# Patient Record
Sex: Female | Born: 1970
Health system: Southern US, Community
[De-identification: ages and names within clinical notes are randomized; demographics above are authoritative.]

## PROBLEM LIST (undated history)

## (undated) DIAGNOSIS — D219 Benign neoplasm of connective and other soft tissue, unspecified: Secondary | ICD-10-CM

## (undated) DIAGNOSIS — E119 Type 2 diabetes mellitus without complications: Secondary | ICD-10-CM

## (undated) DIAGNOSIS — D649 Anemia, unspecified: Secondary | ICD-10-CM

## (undated) DIAGNOSIS — E669 Obesity, unspecified: Secondary | ICD-10-CM

## (undated) DIAGNOSIS — J42 Unspecified chronic bronchitis: Secondary | ICD-10-CM

## (undated) DIAGNOSIS — E785 Hyperlipidemia, unspecified: Secondary | ICD-10-CM

## (undated) DIAGNOSIS — M549 Dorsalgia, unspecified: Secondary | ICD-10-CM

## (undated) DIAGNOSIS — G8929 Other chronic pain: Secondary | ICD-10-CM

## (undated) DIAGNOSIS — I10 Essential (primary) hypertension: Secondary | ICD-10-CM

## (undated) HISTORY — DX: Essential (primary) hypertension: I10

## (undated) HISTORY — DX: Type 2 diabetes mellitus without complications: E11.9

## (undated) HISTORY — DX: Hyperlipidemia, unspecified: E78.5

## (undated) HISTORY — DX: Dorsalgia, unspecified: M54.9

## (undated) HISTORY — PX: TIBIA FRACTURE SURGERY: SHX806

## (undated) HISTORY — DX: Other chronic pain: G89.29

## (undated) HISTORY — DX: Obesity, unspecified: E66.9

## (undated) HISTORY — PX: CHOLECYSTECTOMY: SHX55

---

## 1998-06-27 ENCOUNTER — Inpatient Hospital Stay (HOSPITAL_COMMUNITY): Admission: AD | Admit: 1998-06-27 | Discharge: 1998-06-30 | Payer: Self-pay | Admitting: Obstetrics and Gynecology

## 1999-12-27 ENCOUNTER — Other Ambulatory Visit: Admission: RE | Admit: 1999-12-27 | Discharge: 1999-12-27 | Payer: Self-pay | Admitting: Obstetrics and Gynecology

## 2003-12-14 ENCOUNTER — Ambulatory Visit (HOSPITAL_COMMUNITY): Admission: RE | Admit: 2003-12-14 | Discharge: 2003-12-14 | Payer: Self-pay | Admitting: Obstetrics and Gynecology

## 2005-04-15 ENCOUNTER — Ambulatory Visit: Payer: Self-pay | Admitting: Internal Medicine

## 2005-10-21 ENCOUNTER — Ambulatory Visit: Payer: Self-pay | Admitting: Internal Medicine

## 2005-10-24 ENCOUNTER — Ambulatory Visit: Payer: Self-pay | Admitting: Internal Medicine

## 2005-10-30 ENCOUNTER — Encounter: Payer: Self-pay | Admitting: Internal Medicine

## 2005-11-02 ENCOUNTER — Encounter: Payer: Self-pay | Admitting: Internal Medicine

## 2006-02-21 ENCOUNTER — Ambulatory Visit: Payer: Self-pay | Admitting: Internal Medicine

## 2006-02-21 ENCOUNTER — Encounter: Payer: Self-pay | Admitting: Family Medicine

## 2006-04-29 ENCOUNTER — Ambulatory Visit (HOSPITAL_COMMUNITY): Admission: EM | Admit: 2006-04-29 | Discharge: 2006-04-30 | Payer: Self-pay | Admitting: Emergency Medicine

## 2006-04-29 ENCOUNTER — Encounter (INDEPENDENT_AMBULATORY_CARE_PROVIDER_SITE_OTHER): Payer: Self-pay | Admitting: *Deleted

## 2006-04-29 ENCOUNTER — Ambulatory Visit: Payer: Self-pay | Admitting: Family Medicine

## 2006-04-29 LAB — CONVERTED CEMR LAB
AST: 33 units/L (ref 0–37)
Albumin: 3.5 g/dL (ref 3.5–5.2)
Alkaline Phosphatase: 59 units/L (ref 39–117)
Basophils Relative: 0 % (ref 0.0–1.0)
Creatinine, Ser: 0.9 mg/dL (ref 0.4–1.2)
GFR calc Af Amer: 91 mL/min
HCT: 37.8 % (ref 36.0–46.0)
Lipase: 19 units/L (ref 11.0–59.0)
MCHC: 32.6 g/dL (ref 30.0–36.0)
MCV: 78.4 fL (ref 78.0–100.0)
Monocytes Absolute: 1.8 10*3/uL — ABNORMAL HIGH (ref 0.2–0.7)
Monocytes Relative: 9.3 % (ref 3.0–11.0)
Neutro Abs: 14.7 10*3/uL — ABNORMAL HIGH (ref 1.4–7.7)
Neutrophils Relative %: 75.4 % (ref 43.0–77.0)
Platelets: 428 10*3/uL — ABNORMAL HIGH (ref 150–400)
Potassium: 3.8 meq/L (ref 3.5–5.1)
Sodium: 139 meq/L (ref 135–145)
Total Bilirubin: 0.9 mg/dL (ref 0.3–1.2)
Total Protein: 7.3 g/dL (ref 6.0–8.3)

## 2006-11-07 HISTORY — PX: ENDOMETRIAL ABLATION: SHX621

## 2006-11-13 ENCOUNTER — Ambulatory Visit (HOSPITAL_COMMUNITY): Admission: RE | Admit: 2006-11-13 | Discharge: 2006-11-13 | Payer: Self-pay | Admitting: Obstetrics and Gynecology

## 2006-11-13 ENCOUNTER — Encounter (INDEPENDENT_AMBULATORY_CARE_PROVIDER_SITE_OTHER): Payer: Self-pay | Admitting: Obstetrics and Gynecology

## 2007-01-09 ENCOUNTER — Ambulatory Visit: Payer: Self-pay | Admitting: Internal Medicine

## 2007-01-09 LAB — CONVERTED CEMR LAB
Bilirubin Urine: NEGATIVE
Glucose, Urine, Semiquant: NEGATIVE
Protein, U semiquant: 100
Urobilinogen, UA: 0.2

## 2007-07-07 ENCOUNTER — Telehealth (INDEPENDENT_AMBULATORY_CARE_PROVIDER_SITE_OTHER): Payer: Self-pay | Admitting: *Deleted

## 2007-08-05 ENCOUNTER — Telehealth (INDEPENDENT_AMBULATORY_CARE_PROVIDER_SITE_OTHER): Payer: Self-pay | Admitting: *Deleted

## 2007-09-16 ENCOUNTER — Ambulatory Visit: Payer: Self-pay | Admitting: Internal Medicine

## 2007-09-16 DIAGNOSIS — I10 Essential (primary) hypertension: Secondary | ICD-10-CM

## 2008-04-21 ENCOUNTER — Ambulatory Visit: Payer: Self-pay | Admitting: Family Medicine

## 2008-04-21 ENCOUNTER — Encounter (INDEPENDENT_AMBULATORY_CARE_PROVIDER_SITE_OTHER): Payer: Self-pay | Admitting: Internal Medicine

## 2008-10-27 ENCOUNTER — Telehealth: Payer: Self-pay | Admitting: Internal Medicine

## 2008-11-07 ENCOUNTER — Ambulatory Visit: Payer: Self-pay | Admitting: Family Medicine

## 2008-11-07 ENCOUNTER — Encounter (INDEPENDENT_AMBULATORY_CARE_PROVIDER_SITE_OTHER): Payer: Self-pay | Admitting: *Deleted

## 2008-11-07 LAB — CONVERTED CEMR LAB: Rapid Strep: POSITIVE

## 2008-11-09 ENCOUNTER — Telehealth: Payer: Self-pay | Admitting: Family Medicine

## 2008-11-09 ENCOUNTER — Ambulatory Visit: Payer: Self-pay | Admitting: Internal Medicine

## 2009-01-10 ENCOUNTER — Ambulatory Visit: Payer: Self-pay | Admitting: Internal Medicine

## 2009-01-11 LAB — CONVERTED CEMR LAB
ALT: 58 units/L — ABNORMAL HIGH (ref 0–35)
AST: 29 units/L (ref 0–37)
Albumin: 3.4 g/dL — ABNORMAL LOW (ref 3.5–5.2)
Alkaline Phosphatase: 62 units/L (ref 39–117)
BUN: 14 mg/dL (ref 6–23)
Bilirubin, Direct: 0.1 mg/dL (ref 0.0–0.3)
Calcium: 8.8 mg/dL (ref 8.4–10.5)
Cholesterol: 238 mg/dL — ABNORMAL HIGH (ref 0–200)
Direct LDL: 187.4 mg/dL
Eosinophils Absolute: 0.2 10*3/uL (ref 0.0–0.7)
GFR calc non Af Amer: 119.27 mL/min (ref >60)
HCT: 38.4 % (ref 36.0–46.0)
HDL: 45 mg/dL (ref >39.00)
Lymphocytes Relative: 40.1 % (ref 12.0–46.0)
Lymphs Abs: 3.8 10*3/uL (ref 0.7–4.0)
MCV: 82.4 fL (ref 78.0–100.0)
Phosphorus: 4.2 mg/dL (ref 2.3–4.6)
Platelets: 329 10*3/uL (ref 150.0–400.0)
Potassium: 3.4 meq/L — ABNORMAL LOW (ref 3.5–5.1)
RDW: 13.5 % (ref 11.5–14.6)
Sodium: 142 meq/L (ref 135–145)
Total Protein: 7 g/dL (ref 6.0–8.3)
VLDL: 24.4 mg/dL (ref 0.0–40.0)
WBC: 9.6 10*3/uL (ref 4.5–10.5)

## 2009-01-26 ENCOUNTER — Ambulatory Visit: Payer: Self-pay | Admitting: Internal Medicine

## 2009-01-26 LAB — CONVERTED CEMR LAB
Albumin: 3.7 g/dL (ref 3.5–5.2)
BUN: 13 mg/dL (ref 6–23)
Calcium: 9.2 mg/dL (ref 8.4–10.5)
Phosphorus: 3.6 mg/dL (ref 2.3–4.6)

## 2009-05-31 ENCOUNTER — Telehealth: Payer: Self-pay | Admitting: Family Medicine

## 2009-05-31 ENCOUNTER — Ambulatory Visit: Payer: Self-pay | Admitting: Family Medicine

## 2009-06-06 ENCOUNTER — Ambulatory Visit: Payer: Self-pay | Admitting: Internal Medicine

## 2009-06-07 LAB — CONVERTED CEMR LAB
Albumin: 3.5 g/dL (ref 3.5–5.2)
BUN: 8 mg/dL (ref 6–23)
CO2: 32 meq/L (ref 19–32)
Creatinine, Ser: 0.7 mg/dL (ref 0.4–1.2)
Glucose, Bld: 61 mg/dL — ABNORMAL LOW (ref 70–99)
Sodium: 142 meq/L (ref 135–145)

## 2009-06-09 ENCOUNTER — Telehealth: Payer: Self-pay | Admitting: Internal Medicine

## 2009-06-09 ENCOUNTER — Encounter: Payer: Self-pay | Admitting: Internal Medicine

## 2009-11-06 ENCOUNTER — Telehealth: Payer: Self-pay | Admitting: Family Medicine

## 2010-01-04 ENCOUNTER — Ambulatory Visit: Payer: Self-pay | Admitting: Family Medicine

## 2010-04-06 ENCOUNTER — Telehealth: Payer: Self-pay | Admitting: Internal Medicine

## 2010-05-06 LAB — CONVERTED CEMR LAB
ALT: 40 units/L — ABNORMAL HIGH (ref 0–35)
Basophils Relative: 1.1 % — ABNORMAL HIGH (ref 0.0–1.0)
Creatinine, Ser: 0.8 mg/dL (ref 0.4–1.2)
Eosinophils Relative: 0.7 % (ref 0.0–5.0)
GFR calc non Af Amer: 85 mL/min
Glucose, Urine, Semiquant: NEGATIVE
Hemoglobin: 12.1 g/dL (ref 12.0–15.0)
Lymphocytes Relative: 39.2 % (ref 12.0–46.0)
MCHC: 33.8 g/dL (ref 30.0–36.0)
MCV: 80.2 fL (ref 78.0–100.0)
Monocytes Absolute: 0.6 10*3/uL (ref 0.1–1.0)
Neutro Abs: 4.7 10*3/uL (ref 1.4–7.7)
Neutrophils Relative %: 52 % (ref 43.0–77.0)
Nitrite: NEGATIVE
Phosphorus: 4.2 mg/dL (ref 2.3–4.6)
Platelets: 337 10*3/uL (ref 150–400)
RBC: 4.47 M/uL (ref 3.87–5.11)
Sed Rate: 19 mm/hr (ref 0–22)
TSH: 1.03 microintl units/mL (ref 0.35–5.50)
Total Bilirubin: 0.7 mg/dL (ref 0.3–1.2)
Urobilinogen, UA: 0.2
WBC Urine, dipstick: NEGATIVE
WBC: 9.1 10*3/uL (ref 4.5–10.5)

## 2010-05-10 NOTE — Assessment & Plan Note (Signed)
Summary: FOLLUP UP, ALC   Vital Signs:  Patient profile:   40 year old female Weight:      294 pounds Temp:     99.0 degrees F oral Pulse rate:   80 / minute Pulse rhythm:   regular Resp:     12 per minute BP sitting:   140 / 78  (left arm) Cuff size:   large  Vitals Entered By: Mervin Hack CMA Duncan Dull) (June 06, 2009 11:53 AM) CC: cough/ congestion   History of Present Illness: Cold is worse started with nagging cough then worsened couldn't sleep--now better with the hycodan  wheezing some SOB cough is dry  some nasal drainage and PND No recent sore throat---just dry and scratchy from the cough No ear pain  No history of asthma  Doing well on current med Checks BP herself--generally 120's/70's No cough or throat symptoms till she got sick no chest pain, dizziness or headaches  Allergies: No Known Drug Allergies  Past History:  Past medical, surgical, family and social histories (including risk factors) reviewed for relevance to current acute and chronic problems.  Past Medical History: Reviewed history from 09/16/2007 and no changes required. Hypertension Obesity  Past Surgical History: Reviewed history from 09/16/2007 and no changes required. C-section x 2 1981  Right leg fracture 1/08 Cholecystectomy 8/08 Endometrial ablation  Family History: Reviewed history from 01/10/2009 and no changes required. Dad with HTN Mom with HTN, DM, CAD glaucoma 1 brothers-1 with seizures 2 sisters--1 has some heart trouble HTN strong in the family CAD strong in family--sister, GM, mom Breast cancer in a distant cousin Bone/lung cancer on dad's side  Social History: Reviewed history from 09/16/2007 and no changes required. 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  NAD Head:  no sinus tenderness Ears:  R ear normal and L ear normal.   Nose:  marked left nasal congestion with white secretions only mild on right Mouth:  no erythema and no exudates.   Neck:  supple, no masses, and no cervical lymphadenopathy.   Lungs:  normal respiratory effort, no intercostal retractions, and no accessory muscle use.  Slight insp and exp wheeze or rhonchi Not tight (no increased exp phase) Heart:  normal rate, regular rhythm, no murmur, and no gallop.   Extremities:  no edema Psych:  normally interactive, good eye contact, not anxious appearing, and not depressed appearing.     Impression & Recommendations:  Problem # 1:  BRONCHITIS- ACUTE (ICD-466.0) Assessment New  persistent infection may suggest atypical bronchitis will try z-pak consider prednisone if breathing gets worse or she is tight  Her updated medication list for this problem includes:    Azithromycin 250 Mg Tabs (Azithromycin) .Marland Kitchen... 2 tabs today, then 1 tab daily for the next 4 days for bronchial infection  Problem # 2:  HYPERTENSION (ICD-401.9) Assessment: Unchanged  doing well on this good measurements at home will recheck renal  Her updated medication list for this problem includes:    Lisinopril-hydrochlorothiazide 10-12.5 Mg Tabs (Lisinopril-hydrochlorothiazide) .Marland Kitchen... Take 1 by mouth once daily  BP today: 140/78 Prior BP: 120/80 (05/31/2009)  Labs Reviewed: K+: 4.2 (01/26/2009) Creat: : 0.8 (01/26/2009)   Chol: 238 (01/10/2009)   HDL: 45.00 (01/10/2009)   TG: 122.0 (  01/10/2009)  Orders: Venipuncture ([address]) TLB-Renal Function Panel (80069-RENAL)  Complete Medication List: 1)  Naproxen 500 Mg Tabs (Naproxen) .Marland Kitchen.. 1 tablet two times a day with food 2)  Lisinopril-hydrochlorothiazide 10-12.5 Mg Tabs (Lisinopril-hydrochlorothiazide) .... Take 1 by mouth once daily 3)  Hydrocodone-acetaminophen 5-325 Mg Tabs (Hydrocodone-acetaminophen)  .Marland Kitchen.. 1 three times a day as needed for severe menstrual pain 4)  Azithromycin 250 Mg Tabs (Azithromycin) .... 2 tabs today, then 1 tab daily for the next 4 days for bronchial infection  Patient Instructions: 1)  Please schedule a follow-up appointment in 6 months for physical Prescriptions: AZITHROMYCIN 250 MG TABS (AZITHROMYCIN) 2 tabs today, then 1 tab daily for the next 4 days for bronchial infection  #6 x 0   Entered and Authorized by:   Cindee Salt MD   Signed by:   Cindee Salt MD on 06/06/2009   Method used:   Electronically to        [facility] [phone]* (retail)       [address] [place], [place]  [address]       Ph: [phone]       Fax: [phone]   RxID:   [id number] HYDROCODONE-ACETAMINOPHEN 5-325 MG TABS (HYDROCODONE-ACETAMINOPHEN) 1 three times a day as needed for severe menstrual pain  #30 x 0   Entered and Authorized by:   Cindee Salt MD   Signed by:   Cindee Salt MD on 06/06/2009   Method used:   Print then Give to Patient   RxID:   [id number]   Current Allergies (reviewed today): No known allergies

## 2010-05-10 NOTE — Assessment & Plan Note (Signed)
Summary: BAD COUGH  CYD   Vital Signs:  Patient profile:   40 year old female Height:      69 inches Weight:      301.75 pounds BMI:     44.72 Temp:     99.2 degrees F oral Pulse rate:   92 / minute Pulse rhythm:   regular BP sitting:   122 / 86  (left arm) Cuff size:   large  Vitals Entered By: Delilah Shan CMA Duncan Dull) (January 04, 2010 4:16 PM) CC: Bad cough   History of Present Illness: Cough- started getting sick about 1 week ago.  Friday night through "Sunday with fatigue, cough, congestion, aches, rhinorrhea.  No fever.  Sx some better since then but with occ wheeze, but no h/o asthma.  Cough persists.  Less throat pain now.  People at work have been ill.  Never used inhaler before.  Cough and wheeze are worse at night.    Cruise in 2 weeks to [place].  Asking for advice re: otc meds for motion sickness.  Rec OTC dramamine.    Allergies: No Known Drug Allergies  Social History: Occupation: Insurance analyst, phone audits Separated--2 children at home Caregiving for mom now Never Smoked Alcohol use-no  Review of Systems       See HPI.  Otherwise negative.    Physical Exam  General:  GEN: nad, alert and oriented HEENT: mucous membranes moist NECK: supple w/o LA CV: rrr.  no murmur PULM: ctab, no inc wob ABD: soft, +bs EXT: no edema SKIN: no acute rash    Impression & Recommendations:  Problem # 1:  COUGH (ICD-786.2) Likely postviral cough.  D/w patient.  No need for antibiotics.  use SABA in meantime as needed and this should gradually improve.  follow up as needed. She understood.   Complete Medication List: 1)  Naproxen 500 Mg Tabs (Naproxen) .... 1 tablet two times a day with food 2)  Lisinopril-hydrochlorothiazide 10-12.5 Mg Tabs (Lisinopril-hydrochlorothiazide) .... Take 1 by mouth once daily 3)  Hydrocodone-acetaminophen 5-325 Mg Tabs (Hydrocodone-acetaminophen) .... 1 three times a day as needed for severe menstrual pain 4)  Proventil Hfa 108 (90  Base) Mcg/act Aers (Albuterol sulfate) .... 2 puffs q4-6 hours as needed for cough/wheeze  Patient Instructions: 1)  Use the proventil- 2 puffs every 4-6 hours as needed.  Let me know if this isn't helping.  Have a good trip.  Take care.  Prescriptions: PROVENTIL HFA 108 (90 BASE) MCG/ACT AERS (ALBUTEROL SULFATE) 2 puffs q4-6 hours as needed for cough/wheeze  #1 x 2   Entered and Authorized by:   Graham Duncan MD   Signed by:   Graham Duncan MD on 01/04/2010   Method used:   Electronically to        [facility]  [address] #[id number]* (retail)       [address]       [place], [place]  [address]       Ph: [phone]       Fax: [phone]   RxID:   [id number]   Current Allergies (reviewed today): No known allergies

## 2010-05-10 NOTE — Letter (Signed)
Summary: Out of Work  [facility]  [address] [place], [place] [address]   Phone: [phone]  Fax: [phone]    June 09, 2009   Employee:  Lindsey Walls    To Whom It May Concern:   For Medical reasons, please excuse the above named employee from work for the following dates:  Start:   06/08/2009  End:   06/12/2009 patient returning back to work  If you need additional information, please feel free to contact our office.         Sincerely,      Tillman Abide, MD

## 2010-05-10 NOTE — Assessment & Plan Note (Signed)
Summary: COUGH AND CONGESTION   Vital Signs:  Patient profile:   40 year old female Height:      69 inches Weight:      295.50 pounds BMI:     43.80 Temp:     98.5 degrees F oral Pulse rate:   68 / minute Pulse rhythm:   regular BP sitting:   120 / 80  (left arm) Cuff size:   large  Vitals Entered By: Linde Gillis CMA Duncan Dull) (May 31, 2009 9:46 AM) CC: cough, congestion   CC:  cough and congestion.  History of Present Illness: 40 year old female:  Cough and sinus drainag efor about a week, wheezing  Having to sit straight  This 40 Years Old Black Female comes in today with complaints of cough, runny nose, mostly coughing and drainage  REVIEW OF SYSTEMS GEN: Acute illness details above. CV: No chest pain or SOB GI: No noted N or V Otherwise, pertinent positives and negatives are noted in the HPI.   GEN: WDWN, NAD; alert,appropriate and cooperative throughout exam HEENT: Normocephalic and atraumatic. Throat clear, w/o exudate, no LAD, R TM clear, L TM - good landmarks, No fluid present. rhinnorhea.  Left frontal and maxillary sinuses: NT Right frontal and maxillary sinuses: NT NECK: No ant or post LAD CV: RRR, No M/G/R PULM: no resp distress, no accessory muscles.  No retractions. no w/c/r ABD: S,NT,ND,+BS, No HSM EXTR: no c/c/e PSYCH: full affect, pleasant, conversant   Allergies (verified): No Known Drug Allergies  Past History:  Past medical, surgical, family and social histories (including risk factors) reviewed, and no changes noted (except as noted below).  Past Medical History: Reviewed history from 09/16/2007 and no changes required. Hypertension Obesity  Past Surgical History: Reviewed history from 09/16/2007 and no changes required. C-section x 2 1981  Right leg fracture 1/08 Cholecystectomy 8/08 Endometrial ablation  Family History: Reviewed history from 01/10/2009 and no changes required. Dad with HTN Mom with HTN, DM, CAD glaucoma 1  brothers-1 with seizures 2 sisters--1 has some heart trouble HTN strong in the family CAD strong in family--sister, GM, mom Breast cancer in a distant cousin Bone/lung cancer on dad's side  Social History: Reviewed history from 09/16/2007 and no changes required. 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 1 tablet two times a day with food  Instructed on symptomatic treatment. Call if symptoms persist or worsen.   Complete Medication List: 1)  Naproxen 500 Mg Tabs (Naproxen) .Marland Kitchen.. 1 tablet two times a day with food 2)  Propoxyphene N-apap 100-650 Mg Tabs (Propoxyphene n-apap) .Marland Kitchen.. 1every 4 hours as needed 3)  Lisinopril-hydrochlorothiazide 10-12.5 Mg Tabs (Lisinopril-hydrochlorothiazide) .... Take 1 by mouth once daily  Patient Instructions: 1)  Mucinex-DM or Robitussin-DM  Current Allergies (reviewed today): No known allergies

## 2010-05-10 NOTE — Progress Notes (Signed)
Summary: Hydrocodone/APAP  Phone Note Refill Request Message from:  [employer] on November 06, 2009 9:52 AM  Refills Requested: Medication #1:  HYDROCODONE-ACETAMINOPHEN 5-325 MG TABS 1 three times a day as needed for severe menstrual pain. [facility]  [place]. #[id number]*   Last Fill Date:  08/16/2009   Pharmacy Phone:  [phone]   Method Requested: Telephone to Pharmacy Initial call taken by: Delilah Shan CMA Duncan Dull),  November 06, 2009 9:52 AM  Follow-up for Phone Call        Rx called to pharmacy Follow-up by: Linde Gillis CMA Duncan Dull),  November 06, 2009 10:15 AM    Prescriptions: HYDROCODONE-ACETAMINOPHEN 5-325 MG TABS (HYDROCODONE-ACETAMINOPHEN) 1 three times a day as needed for severe menstrual pain  #30 x 0   Entered and Authorized by:   Ruthe Mannan MD   Signed by:   Ruthe Mannan MD on 11/06/2009   Method used:   Print then Give to Patient   RxID:   [id number]

## 2010-05-10 NOTE — Consult Note (Signed)
Summary: Dr.Christopher Blackman,[facility],Note  Dr.Christopher Blackman,[facility],Note   Imported By: Beau Fanny 02/14/2010 13:53:18  _____________________________________________________________________  External Attachment:    Type:   Image     Comment:   External Document

## 2010-05-10 NOTE — Progress Notes (Signed)
Summary: Needs note  Phone Note Call from Patient Call back at Home Phone [phone] Call back at Work Phone [phone]   Caller: Patient Call For: Cindee Salt MD Summary of Call: Pt was out of work yesterday and she's at work now, leaving half-day. Pt states she still feels weak, and wheezing. Pt would like a note for work for yesterday and half-day today. Please advise. Initial call taken by: Mervin Hack CMA Duncan Dull),  June 09, 2009 9:14 AM  Follow-up for Phone Call        okay to write note Follow-up by: Cindee Salt MD,  June 09, 2009 10:48 AM  Additional Follow-up for Phone Call Additional follow up Details #1::        note done and patient advised, faxed to work number, [phone]. Additional Follow-up by: Mervin Hack CMA Duncan Dull),  June 09, 2009 10:57 AM

## 2010-05-10 NOTE — Progress Notes (Signed)
Summary: refill request for vicodin  Phone Note Refill Request Message from:  Fax from Pharmacy  Refills Requested: Medication #1:  HYDROCODONE-ACETAMINOPHEN 5-325 MG TABS 1 three times a day as needed for severe menstrual pain   Last Refilled: 11/06/2009 [facility] [address], [phone]  Initial call taken by: Lowella Petties CMA, AAMA,  April 06, 2010 4:29 PM  Follow-up for Phone Call        okay #30 x 0 Follow-up by: Cindee Salt MD,  April 09, 2010 9:14 AM  Additional Follow-up for Phone Call Additional follow up Details #1::        Rx called to pharmacy Additional Follow-up by: DeShannon Smith CMA Duncan Dull),  April 10, 2010 9:03 AM    Prescriptions: HYDROCODONE-ACETAMINOPHEN 5-325 MG TABS (HYDROCODONE-ACETAMINOPHEN) 1 three times a day as needed for severe menstrual pain  #30 x 0   Entered by:   Mervin Hack CMA (AAMA)   Authorized by:   Cindee Salt MD   Signed by:   Mervin Hack CMA (AAMA) on 04/10/2010   Method used:   Telephoned to ...       [facility] [phone]* (retail)       [address] [place], [place]  [address]       Ph: [phone]       Fax: [phone]   RxID:   [id number]

## 2010-05-10 NOTE — Progress Notes (Signed)
Summary: Cold, Cough  Phone Note Call from Patient Call back at Home Phone [phone] Call back at Work Phone [phone]   Caller: Patient Call For: Cindee Salt MD Summary of Call: pt was in today for cold, cough, wheezing,  sore throat and trouble sleeping at night, pt was told to take Robitussin, pt would like rx called in for cough and something to help sleep. Please advise Initial call taken by: Mervin Hack CMA Duncan Dull),  May 31, 2009 2:06 PM  Follow-up for Phone Call        Hycodan susp, 1 tsp by mouth at bedtime as needed cough. 8 oz, 0 refills Follow-up by: Hannah Beat MD,  May 31, 2009 2:31 PM  Additional Follow-up for Phone Call Additional follow up Details #1::        Rx Called In, patient notified.  Advised that if her SOB or wheezing gets worse overnight go to ER.  Follow up in the next few days if symptoms are not improving, gets worse, or develops new symptoms.   Additional Follow-up by: Linde Gillis CMA Duncan Dull),  May 31, 2009 3:06 PM    New/Updated Medications: * HYCODAN SUSP #8OZ one teaspoon by mouth at bedtime as needed for cough.  Prior Medications: NAPROXEN 500 MG  TABS (NAPROXEN) 1 TABLET two times a day WITH FOOD PROPOXYPHENE N-APAP 100-650 MG  TABS (PROPOXYPHENE N-APAP) 1every 4 hours as needed LISINOPRIL-HYDROCHLOROTHIAZIDE 10-12.5 MG TABS (LISINOPRIL-HYDROCHLOROTHIAZIDE) take 1 by mouth once daily Current Allergies: No known allergies

## 2010-05-22 ENCOUNTER — Encounter: Payer: Self-pay | Admitting: Internal Medicine

## 2010-05-22 ENCOUNTER — Other Ambulatory Visit: Payer: Self-pay | Admitting: Internal Medicine

## 2010-05-22 ENCOUNTER — Encounter (INDEPENDENT_AMBULATORY_CARE_PROVIDER_SITE_OTHER): Payer: BC Managed Care – PPO | Admitting: Internal Medicine

## 2010-05-22 DIAGNOSIS — I10 Essential (primary) hypertension: Secondary | ICD-10-CM

## 2010-05-22 DIAGNOSIS — E785 Hyperlipidemia, unspecified: Secondary | ICD-10-CM | POA: Insufficient documentation

## 2010-05-22 DIAGNOSIS — Z0000 Encounter for general adult medical examination without abnormal findings: Secondary | ICD-10-CM

## 2010-05-22 LAB — CBC WITH DIFFERENTIAL/PLATELET
Basophils Absolute: 0 10*3/uL (ref 0.0–0.1)
Eosinophils Absolute: 0.1 10*3/uL (ref 0.0–0.7)
Eosinophils Relative: 1 % (ref 0.0–5.0)
Hemoglobin: 12.3 g/dL (ref 12.0–15.0)
Lymphocytes Relative: 40.5 % (ref 12.0–46.0)
Lymphs Abs: 4.7 10*3/uL — ABNORMAL HIGH (ref 0.7–4.0)
Monocytes Relative: 6.3 % (ref 3.0–12.0)
Neutro Abs: 6 10*3/uL (ref 1.4–7.7)
Neutrophils Relative %: 51.9 % (ref 43.0–77.0)
Platelets: 322 10*3/uL (ref 150.0–400.0)
RBC: 4.5 Mil/uL (ref 3.87–5.11)

## 2010-05-22 LAB — HEPATIC FUNCTION PANEL
AST: 22 U/L (ref 0–37)
Albumin: 3.6 g/dL (ref 3.5–5.2)
Total Bilirubin: 0.7 mg/dL (ref 0.3–1.2)
Total Protein: 7 g/dL (ref 6.0–8.3)

## 2010-05-22 LAB — RENAL FUNCTION PANEL
Albumin: 3.6 g/dL (ref 3.5–5.2)
BUN: 16 mg/dL (ref 6–23)
Chloride: 108 mEq/L (ref 96–112)
Creatinine, Ser: 0.7 mg/dL (ref 0.4–1.2)
Glucose, Bld: 79 mg/dL (ref 70–99)
Phosphorus: 3.7 mg/dL (ref 2.3–4.6)
Potassium: 4.5 mEq/L (ref 3.5–5.1)

## 2010-05-22 LAB — LIPID PANEL
HDL: 54.2 mg/dL (ref >39.00)
Triglycerides: 111 mg/dL (ref 0.0–149.0)

## 2010-05-30 NOTE — Assessment & Plan Note (Signed)
Summary: CPE/CLE   Vital Signs:  Patient profile:   40 year old female Weight:      299 pounds Temp:     98.8 degrees F oral Pulse rate:   69 / minute Pulse rhythm:   regular BP sitting:   140 / 92  (right arm) Cuff size:   large  Vitals Entered By: Mervin Hack CMA Duncan Dull) (May 22, 2010 11:58 AM) CC: adult physical   History of Present Illness: Feels "wonderful" Still seperated but doing well Kids successful in school  sees Dr [facility] Has had pap and mammo in August  Occ takes BP Has been really good at gyn and monthly usually 130/75 or so  Allergies: No Known Drug Allergies  Past History:  Past medical, surgical, family and social histories (including risk factors) reviewed for relevance to current acute and chronic problems.  Past Medical History: Hypertension Obesity Hyperlipidemia  Past Surgical History: Reviewed history from 09/16/2007 and no changes required. C-section x 2 1981  Right leg fracture 1/08 Cholecystectomy 8/08 Endometrial ablation  Family History: Dad with HTN Mom died  @71  of infection from pacemaker. 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 Eyes:  Denies double vision and vision loss-1 eye. ENT:  Denies decreased hearing and ringing in ears; teeth fine---keeps up with the dentist. CV:  Denies chest pain or discomfort, difficulty breathing at night, difficulty breathing while lying down, fainting, lightheadness, palpitations, and shortness of breath with exertion. Resp:  Complains of wheezing; denies cough and  shortness of breath; rarely uses inhaler---only occ hears some wheezing (twice a month at most). GI:  Denies abdominal pain, bloody stools, change in bowel habits, dark tarry stools, indigestion, nausea, and vomiting; has bulging around umbilicus--esp if leans in certain way. No  pain. GU:  Denies dysuria and incontinence; does have some urinary urgency Periods are regular and normal flow No sexual problems. MS:  Denies joint pain and joint swelling; has hand pain at times for carpal tunnel--uses splints at night as needed . Derm:  Denies lesion(s) and rash; has skin tags under breasts and right axilla. Neuro:  Denies headaches, numbness, tingling, and weakness. Psych:  Denies anxiety and depression. Heme:  Denies abnormal bruising and enlarge lymph nodes. Allergy:  Denies seasonal allergies and sneezing.  Physical Exam  General:  alert and normal appearance.   Eyes:  pupils equal, pupils round, pupils reactive to light, and no optic disk abnormalities.   Ears:  R ear normal and L ear normal.   Mouth:  no erythema, no exudates, and no lesions.   Neck:  supple, no masses, no thyromegaly, no carotid bruits, and no cervical lymphadenopathy.   Lungs:  normal respiratory effort, no intercostal retractions, no accessory muscle use, and normal breath sounds.   Heart:  normal rate, regular rhythm, no murmur, and no gallop.   Abdomen:  soft, non-tender, and no masses.   Msk:  no joint tenderness and no joint swelling.   Pulses:  1+ in feet Extremities:  no sig edema Neurologic:  alert & oriented X3, strength normal in all extremities, and gait normal.  Skin:  no suspicious lesions and no ulcerations.   Inflamed skin tags under right breast and in right axilla Axillary Nodes:  No palpable lymphadenopathy Psych:  normally interactive, good eye contact, not anxious appearing, and not depressed appearing.     Impression & Recommendations:  Problem # 1:  PREVENTIVE HEALTH CARE  (ICD-V70.0) Assessment Comment Only doing well discussed fitness and proper eating has regular gyn care  Problem # 2:  HYPERTENSION (ICD-401.9) Assessment: Unchanged  reasonable control will work on lifestyle measures check labs  Her updated medication list for this problem includes:    Lisinopril-hydrochlorothiazide 10-12.5 Mg Tabs (Lisinopril-hydrochlorothiazide) .Marland Kitchen... Take 1 by mouth once daily  BP today: 140/92  Prior BP: 122/86 (01/04/2010)  Labs Reviewed: K+: 3.5 (06/06/2009) Creat: : 0.7 (06/06/2009)   Chol: 238 (01/10/2009)   HDL: 45.00 (01/10/2009)   TG: 122.0 (01/10/2009)  Orders: Venipuncture ([address]) TLB-Renal Function Panel (80069-RENAL) TLB-CBC Platelet - w/Differential (85025-CBCD) TLB-Hepatic/Liver Function Pnl (80076-HEPATIC) TLB-TSH (Thyroid Stimulating Hormone) (84443-TSH)  Complete Medication List: 1)  Naproxen 500 Mg Tabs (Naproxen) .Marland Kitchen.. 1 tablet two times a day with food 2)  Lisinopril-hydrochlorothiazide 10-12.5 Mg Tabs (Lisinopril-hydrochlorothiazide) .... Take 1 by mouth once daily 3)  Hydrocodone-acetaminophen 5-325 Mg Tabs (Hydrocodone-acetaminophen) .Marland Kitchen.. 1 three times a day as needed for severe menstrual pain 4)  Proventil Hfa 108 (90 Base) Mcg/act Aers (Albuterol sulfate) .... 2 puffs q4-6 hours as needed for cough/wheeze  Other Orders: TLB-Lipid Panel (80061-LIPID)  Patient Instructions: 1)  Set up 15 minute appt to treat skin tags at your conveninence 2)  Please schedule a follow-up appointment in 6 months .    Orders Added: 1)  Est. Patient 40-64 years [99396] 2)  TLB-Lipid Panel [80061-LIPID] 3)  Venipuncture [36415] 4)  TLB-Renal Function Panel [80069-RENAL] 5)  TLB-CBC Platelet - w/Differential [85025-CBCD] 6)  TLB-Hepatic/Liver Function Pnl [80076-HEPATIC] 7)  TLB-TSH (Thyroid Stimulating Hormone) [09811-BJY]    Current Allergies (reviewed today): No known allergies

## 2010-06-21 ENCOUNTER — Ambulatory Visit (INDEPENDENT_AMBULATORY_CARE_PROVIDER_SITE_OTHER): Payer: BC Managed Care – PPO | Admitting: Internal Medicine

## 2010-06-21 ENCOUNTER — Encounter: Payer: Self-pay | Admitting: Internal Medicine

## 2010-06-21 DIAGNOSIS — L919 Hypertrophic disorder of the skin, unspecified: Secondary | ICD-10-CM | POA: Insufficient documentation

## 2010-06-21 DIAGNOSIS — L909 Atrophic disorder of skin, unspecified: Secondary | ICD-10-CM | POA: Insufficient documentation

## 2010-06-26 NOTE — Assessment & Plan Note (Signed)
Summary: SKIN TAGS REMOVAL / LFW   Vital Signs:  Patient profile:   40 year old female Weight:      300 pounds Temp:     98.0 degrees F oral Pulse rate:   72 / minute Pulse rhythm:   regular BP sitting:   120 / 80  (left arm) Cuff size:   large  Vitals Entered By: Mervin Hack CMA Duncan Dull) (June 21, 2010 4:20 PM) CC: remove skin tags   History of Present Illness: Here for skin tag removal  Allergies: No Known Drug Allergies  Past History:  Past medical, surgical, family and social histories (including risk factors) reviewed for relevance to current acute and chronic problems.  Past Medical History: Reviewed history from 05/22/2010 and no changes required. Hypertension Obesity Hyperlipidemia  Past Surgical History: Reviewed history from 09/16/2007 and no changes required. C-section x 2 1981  Right leg fracture 1/08 Cholecystectomy 8/08 Endometrial ablation  Family History: Reviewed history from 05/22/2010 and no changes required. Dad with HTN Mom died  @71  of infection from pacemaker. Had  HTN, DM, CAD glaucoma 1 brothers-1 with seizures 2 sisters--1 has some heart trouble HTN strong in the family CAD strong in family--sister, GM, mom Breast cancer in a distant cousin Bone/lung cancer on dad's side  Social History: Reviewed history from 05/22/2010 and no changes required. 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 1 tablet two times a day with food 2)   Lisinopril-hydrochlorothiazide 10-12.5 Mg Tabs (Lisinopril-hydrochlorothiazide) .... Take 1 by mouth once daily 3)  Hydrocodone-acetaminophen 5-325 Mg Tabs (Hydrocodone-acetaminophen) .Marland Kitchen.. 1 three times a day as needed for severe menstrual pain 4)  Proventil Hfa 108 (90 Base) Mcg/act Aers (Albuterol sulfate) .... 2 puffs q4-6 hours as needed for cough/wheeze  Patient Instructions: 1)  Keep regular follow up   Orders Added: 1)  Removal of Skin Tags up to 15 Lesions [11200]    Current Allergies (reviewed today): No known allergies

## 2010-08-21 NOTE — Op Note (Signed)
Lindsey Walls, Lindsey Walls               ACCOUNT NO.:  [id number]   MEDICAL RECORD NO.:  [id number]          PATIENT TYPE:  AMB   LOCATION:  SDC                           FACILITY:  [facility]   PHYSICIAN:  Malva Limes, M.D.    DATE OF BIRTH:  [birth year]   DATE OF PROCEDURE:  11/13/2006  DATE OF DISCHARGE:                               OPERATIVE REPORT   PREOPERATIVE DIAGNOSES:  1. Menorrhagia.  2. Fibroids.   POSTOPERATIVE DIAGNOSES:  1. Menorrhagia.  2. Fibroids.   PROCEDURE:  1. Dilation curettage.  2. Endometrial ablation with NovaSure device.   SURGEON:  Dareen Piano.   ANESTHESIA:  General with paracervical block.   DRAINS:  None.   ANTIBIOTICS:  Ancef 1 g.   ESTIMATED BLOOD LOSS:  Minimal.   COMPLICATIONS:  None.   SPECIMENS:  Endometrial curetting sent to pathology.   PROCEDURE:  The patient was taken to the operating room where she had a  general anesthetic administered without complication.  She was then  placed in the dorsal lithotomy position, she was prepped with Betadine  and draped in the usual fashion for this procedure.  A sterile speculum  was placed in the vagina, 20 mL of 1% lidocaine was used for a  paracervical block.  A single-tooth tenaculum was applied to the  anterior cervical lip, the uterus was sounded 12 cm.  The cervix was  serially dilated to a 21 [place], a sharp curettage was then performed,  endometrial curettings were sent to pathology.  The cervical length was  measured at 5 cm.  The NovaSure device was set up at 6.5.  The device  was placed into the fundus and opened, the width was 4.3 cm.  A seal  test was performed and  passed, the device was then turned on and patient tolerated the  procedure well.  The device was then removed.  She was taken to the  recovery room in stable condition.  She will be discharged home.  She  will follow up in the office in 4 weeks.  She was sent home with  Percocet to take p.r.n.     ______________________________  Malva Limes, M.D.     MA/MEDQ  D:  11/13/2006  T:  11/13/2006  Job:  [id number]

## 2010-08-24 NOTE — H&P (Signed)
Lindsey Walls, Lindsey Walls               ACCOUNT NO.:  [id number]   MEDICAL RECORD NO.:  [id number]          PATIENT TYPE:  INP   LOCATION:  0098                         FACILITY:  [facility]   PHYSICIAN:  Anselm Pancoast. Weatherly, M.D.DATE OF BIRTH:  Oct 02, 1970   DATE OF ADMISSION:  04/29/2006  DATE OF DISCHARGE:                              HISTORY & PHYSICAL   CHIEF COMPLAINT:  Severe epigastric pain of 2 days' duration with nausea  and vomiting.   HISTORY:  Lindsey Walls is a 40 year old overweight female who was  referred to [place] from [facility] today, where she saw Dr.  Ermalene Searing at the [facility] clinic.  She had a 2-day history of nausea,  vomiting and right upper quadrant abdominal pain.  An ultrasound was  obtained, which showed an edematous gallbladder with two large stones  and the white count was 19,700.  I was called and I was able to contact  the patient and suggested that she come on to the emergency room, where  she presented.  She said that for about 3 or 4 years she has had  intermittent episodes of epigastric pain that would last about 2 hours,  maybe 3, with nausea.  This, however, did not resolve and she was quite  nauseous and throwing up multiple times yesterday and for this reason  saw her physician today.  She has a history of hypertension, for which  she is on hydrochlorothiazide, and otherwise a negative history with the  exception that she is 272 pounds and has not been successful of dietary  management.  She has had two previous C-sections, no other abdominal  surgery.  In our emergency room we repeated the CBC.  The white count  was 19,500, her hematocrit was normal.  Her liver function studies are  essentially unremarkable.  Glucose was mildly elevated at 130.  Her BUN  is 8.  Electrolytes were normal with a potassium of 3.8.  I obtained a  chest x-ray and EKG, started her on 3 g of Unasyn and admission obtained  for an urgent cholecystectomy.   PHYSICAL EXAMINATION:  GENERAL:  The patient is a pleasant female,  appears her stated age.  On physical exam she appears adequately  hydrated.  VITAL SIGNS:  Her vital signs in the emergency room:  Temperature was  98.8, blood pressure is 143/94, pulse 86, respirations are 20.  She  weighs 272 pounds.  LYMPHATIC:  No cervical or supraclavicular lymphadenopathy.  LUNGS:  Clear.  CARDIAC:  Normal sinus rhythm.  ABDOMEN:  She is definitely tender and you can feel a fullness in the  right upper quadrant.  She is not tender in the lower abdomen.  RECTAL, PELVIC:  I did not do a rectal and pelvic examination.  EXTREMITIES:  She does not have any pedal edema.  CNS:  Appears physiologic.   ADMISSION IMPRESSION:  1. Acute cholecystitis with stones.  Plan:  Urgent laparoscopic      cholecystectomy and cholangiogram.  2. History of mild hypertension.  3. Three exogenous obesity.  ______________________________  Anselm Pancoast. Zachery Dakins, M.D.    WJW/MEDQ  D:  04/29/2006  T:  04/30/2006  Job:  [id number]

## 2010-08-24 NOTE — Op Note (Signed)
NAMESALIMAH, MARTINOVICH               ACCOUNT NO.:  [id number]   MEDICAL RECORD NO.:  [id number]          PATIENT TYPE:  INP   LOCATION:  0098                         FACILITY:  [facility]   PHYSICIAN:  Anselm Pancoast. Weatherly, M.D.DATE OF BIRTH:  October 23, 1970   DATE OF PROCEDURE:  04/29/2006  DATE OF DISCHARGE:                               OPERATIVE REPORT   PREOPERATIVE DIAGNOSIS:  Acute cholecystitis with stones.   POSTOPERATIVE DIAGNOSIS:  Acute cholecystitis with stones.   OPERATION PERFORMED:  Laparoscopic cholecystectomy with cholangiogram,  general anesthesia.   SURGEON:  Anselm Pancoast. Zachery Dakins, M.D.   ASSISTANT:  Sharlet Salina T. Hoxworth, M.D.   ANESTHESIA:  General.   INDICATIONS FOR PROCEDURE:  Caley Ciaramitaro is a 40 year old overweight  female referred by Dr. Perrin Maltese at the [facility] where  Ms. Duncombe had presented today with about a two-day history of nausea,  vomiting, epigastric pain.  Her gallbladder showed stones and edematous  gallbladder.  Her white count was elevated at 19,700.  I was called  about 4, was able to contact the patient.  I asked her to come to the  emergency room.  When she presented here, on exam she is definitely  tender in the upper abdomen.  She is not febrile, said she had had  previous episode of epigastric pain that would last several hours  duration over the past several years but had never proceeded to have any  evaluation of this.  The patient's ultrasound showed stones with  edematous gallbladder and she is here for urgent surgery.  Preoperatively, liver function tests were unremarkable and repeat white  count was significantly elevated at 19,500.  She was given 3 g of  Unasyn.  She has PAS stockings.   DESCRIPTION OF PROCEDURE:  Patient positioned on the operating room  table and we induced general anesthesia, endotracheal tube, oral tube in  the stomach.  The abdomen was prepped with Betadine solution and draped  in a sterile  manner.  We elected to make the little incision above the  umbilicus because of her large size.  Sharp dissection down through  approximately three inches of adipose tissue identifying fascia.  A  small opening  was made, carefully picking up the fascia with two  Kochers and then the underlying fatty tissue I kind of bluntly went  through it with a Kelly and popped through the peritoneum.  A  pursestring suture of 0 Vicryl was placed.  Then a Hasson cannula  introduced.  The gallbladder was acutely inflamed.  There was a little  bit of bile stained fluid up around the gallbladder and the upper 10 mm  trocar was placed in the subxiphoid area under direct vision.  __________ fascia __________ were placed, two lateral 5 mm ports.  We  first kind of pushed the omentum that was adherent to the acutely  inflamed gallbladder so we could visualize the gallbladder, then took  the __________  aspirator and just decompressed the gallbladder.  With  it decompressed we then grasped it with __________  grasper and  retracted it up and outward.  There were adhesions proximally and a very  edematous proximal portion.  We dissected through the peritoneum, we  identified the artery which was doubly clipped proximally and singly  distally and then divided that and then that allowed [place] to visualize the  cystic duct.  I placed a clip across the cystic duct gallbladder  junction.  Small opening made.  Cook catheter introduced.  Held in place  with clip.  X-ray obtained.  It showed good prompt fill of the major  hepatobiliary system.  Fairly long cystic duct, kind of tortuous across  the gallbladder.  It kind of enters from medially.  We removed the  catheters, triply clipped the cystic duct and left probably only a 2 cm  rim.  I did not try to dissect it down to the medial portion of the  common bile duct.  We then divided the cystic duct.  We then freed up  the gallbladder, working in this very edematous bed.   Using  predominantly the electrocautery, I started off with the hook, then  switched to spatula.  On the most distal portion of the gallbladder, the  stone was actually intrahepatic and stone was visualized as we were  dividing it.  We got the gallbladder free, both the gallbladder and the  stones in the EndoCatch bag.  Next, the camera was switched to the upper  10 mm port and the bag containing the gallbladder was withdrawn.  We  pulled it through the fascia and then opened the bag, grasped the  gallbladder, opened it, pulled the stones out so we could get it through  the fascia without enlarging it.  I then put two figure-of-eights of 0  Vicryl in the fascia in addition to the pursestring and then  anesthetized the fascia.  Next, the little irrigating fluid that I used  was aspirated and when we inspected the bed, there was good hemostasis,  no bile, and then carbon dioxide released and 5 mm ports were then  withdrawn by Dr. Johna Sheriff and then I removed the upper 10 mm port.  I  did try to place a single 0 Vicryl suture in the fascia.  It was quite  deep and I think I got the fascia.  Next the subcutaneous wounds were  closed with 4-0 Vicryl and benzoin and Steri-Strips were placed on the  skin of the four incisions.  The patient tolerated the procedure nicely.  Will spend the night.  I am going to give her two additional doses of  antibiotics and hopefully she will be ready to be discharged in the  morning.           ______________________________  Anselm Pancoast. Zachery Dakins, M.D.     WJW/MEDQ  D:  04/29/2006  T:  04/30/2006  Job:  [id number]   cc:   Lorne Skeens. Hoxworth, M.D.  [address]  [place]  [place] [address]

## 2010-09-02 ENCOUNTER — Other Ambulatory Visit: Payer: Self-pay | Admitting: Internal Medicine

## 2010-09-02 NOTE — Telephone Encounter (Signed)
Okay to refill x1 year 

## 2010-10-12 ENCOUNTER — Other Ambulatory Visit: Payer: Self-pay | Admitting: Internal Medicine

## 2010-10-15 NOTE — Telephone Encounter (Signed)
rx called into pharmacy

## 2010-10-15 NOTE — Telephone Encounter (Signed)
Okay #30 x 0 

## 2010-12-26 ENCOUNTER — Other Ambulatory Visit: Payer: Self-pay | Admitting: Obstetrics and Gynecology

## 2011-01-21 LAB — CBC
HCT: 31.4 — ABNORMAL LOW
MCV: 72.4 — ABNORMAL LOW
Platelets: 470 — ABNORMAL HIGH
RBC: 4.34

## 2011-02-15 ENCOUNTER — Other Ambulatory Visit: Payer: Self-pay | Admitting: Internal Medicine

## 2011-04-26 ENCOUNTER — Encounter: Payer: Self-pay | Admitting: *Deleted

## 2011-04-26 ENCOUNTER — Encounter: Payer: Self-pay | Admitting: Internal Medicine

## 2011-04-26 ENCOUNTER — Telehealth: Payer: Self-pay | Admitting: Internal Medicine

## 2011-04-26 ENCOUNTER — Ambulatory Visit (INDEPENDENT_AMBULATORY_CARE_PROVIDER_SITE_OTHER): Payer: 59 | Admitting: Internal Medicine

## 2011-04-26 VITALS — BP 142/80 | HR 88 | Temp 100.7°F | Ht 69.0 in | Wt 298.0 lb

## 2011-04-26 DIAGNOSIS — J020 Streptococcal pharyngitis: Secondary | ICD-10-CM

## 2011-04-26 DIAGNOSIS — J0300 Acute streptococcal tonsillitis, unspecified: Secondary | ICD-10-CM

## 2011-04-26 DIAGNOSIS — J029 Acute pharyngitis, unspecified: Secondary | ICD-10-CM

## 2011-04-26 MED ORDER — PREDNISONE 20 MG PO TABS: 40.0000 mg | ORAL_TABLET | Freq: Every day | ORAL | Status: AC

## 2011-04-26 MED ORDER — PENICILLIN V POTASSIUM 500 MG PO TABS: 1000.0000 mg | ORAL_TABLET | Freq: Two times a day (BID) | ORAL | Status: AC

## 2011-04-26 MED ORDER — HYDROCODONE-ACETAMINOPHEN 5-325 MG PO TABS: 1.0000 | ORAL_TABLET | Freq: Three times a day (TID) | ORAL | Status: DC | PRN

## 2011-04-26 NOTE — Telephone Encounter (Signed)
Triage Record Num: [id number] Operator: Baldomero Lamy Patient Name: Lindsey Walls Call Date & Time: 04/26/2011 10:06:25AM Patient Phone: [phone] PCP: Patient Gender: Female PCP Fax : Patient DOB: 1970-11-26 Practice Name: [facility] Reason for Call: Caller: [name]/Patient; PCP: Tillman Abide I.; CB#: [phone]; ; ; Call regarding Sore Throat THE PATIENT REFUSED 911; Pt calling regarding sore throat, body aches and chills. Onet 1/16. Not taken temp. Emergent sxs of Sore Throat r/o. Disp: See provider w/in 4hrs. Appt for 1115 with Dr. Duane Boston. Relayed to pt. Protocol(s) Used: Sore Throat or Hoarseness Recommended Outcome per Protocol: See Provider within 4 hours Reason for Outcome: Marked difficulty swallowing due to sore throat unresponsive to 12 hours of home care Care Advice: Call EMS 911 if sudden onset or sudden worsening of breathing problems, struggling to breathe, high pitched noise when breathing in (stridor), unable to speak, grasping at throat, or panic/anxiety because of breathing problems. ~ ~ SYMPTOM / CONDITION MANAGEMENT During pregnancy or when breastfeeding, do not take nonprescription, complementary/alternative medication(s) without the approval of provider ~ ~ Call 911 if voice muffled, is unable to swallow own saliva and is drooling or choking sensation. 01/18/

## 2011-04-26 NOTE — Progress Notes (Signed)
  Subjective:    Patient ID: Lindsey Walls, female    DOB: 1970-05-24, 41 y.o.   MRN: [id number]  HPI Sick for past 36hours Sore throat which is terrible Chills and fever Hard to swallow---can barely eat but no appetite Usually spitting out saliva due to pain  No SOB No sig cough Slight ear pain No sig head congestion  Has only tried alka seltzer plus--no help Hot tea also not helpful  Current Outpatient Prescriptions on File Prior to Visit  Medication Sig Dispense Refill  . HYDROcodone-acetaminophen (NORCO) 5-325 MG per tablet take 1 tablet by mouth three times a day for SEVERE MENSTRUAL PAIN  30 tablet  0  . lisinopril-hydrochlorothiazide (PRINZIDE,ZESTORETIC) 10-12.5 MG per tablet TAKE 1 TABLET BY MOUTH ONCE DAILY  30 tablet  11  . naproxen (NAPROSYN) 500 MG tablet TAKE 1 TABLET BY MOUTH TWICE A DAY WITH FOOD  60 tablet  1    No Known Allergies  Past Medical History  Diagnosis Date  . Hypertension   . Hyperlipidemia   . Obesity     Past Surgical History  Procedure Date  . Cesarean section   . Tibia fracture surgery   . Cholecystectomy   . Endometrial ablation 08/08    Family History  Problem Relation Age of Onset  . Coronary artery disease Mother   . Hypertension Father   . Coronary artery disease Sister     History   Social History  . Marital Status: Legally Separated    Spouse Name: N/A    Number of Children: 2  . Years of Education: N/A   Occupational History  . Counsellor    Social History Main Topics  . Smoking status: Never Smoker   . Smokeless tobacco: Never Used  . Alcohol Use: No  . Drug Use: No  . Sexually Active: Not on file   Other Topics Concern  . Not on file   Social History Narrative  . No narrative on file   Review of Systems No rash No vomiting Loose stool 3 days ago     Objective:   Physical Exam  Constitutional: She appears well-developed and well-nourished. No distress.  HENT:  Head: Normocephalic and  atraumatic.       TMs normal Mild nasal congestion 4+ tonsillar swelling with midline apposition Sig exudates No palatal swelling though mild asymmetry with right tonsil larger than left  Neck: Normal range of motion. Neck supple.  Pulmonary/Chest: Effort normal and breath sounds normal. No respiratory distress. She has no wheezes. She has no rales.  Lymphadenopathy:    She has no cervical adenopathy.  Skin: No rash noted.          Assessment & Plan:

## 2011-04-26 NOTE — Assessment & Plan Note (Signed)
Nothing to suggest abscess Size and dysphagia make short course of prednisone a good idea PCN for the strep

## 2011-05-17 ENCOUNTER — Emergency Department: Payer: Self-pay | Admitting: Internal Medicine

## 2011-06-28 ENCOUNTER — Other Ambulatory Visit: Payer: Self-pay | Admitting: Internal Medicine

## 2011-08-14 ENCOUNTER — Telehealth: Payer: Self-pay | Admitting: Internal Medicine

## 2011-08-14 MED ORDER — ALBUTEROL SULFATE HFA 108 (90 BASE) MCG/ACT IN AERS: 2.0000 | INHALATION_SPRAY | Freq: Four times a day (QID) | RESPIRATORY_TRACT | Status: DC | PRN

## 2011-08-14 NOTE — Telephone Encounter (Signed)
aller: Rema/Mother; PCP: Tillman Abide I.; CB#: [phone];  Call regarding Refill; Alycen states she needs refill for Proair Inhaler called in to [facility] [phone]. Trishia can be reached @ [phone] if needed.

## 2011-08-14 NOTE — Telephone Encounter (Signed)
rx sent to pharmacy by e-script  

## 2011-08-14 NOTE — Telephone Encounter (Signed)
Okay #1 x 1 refill 

## 2011-08-19 ENCOUNTER — Telehealth: Payer: Self-pay | Admitting: Internal Medicine

## 2011-08-19 ENCOUNTER — Ambulatory Visit (INDEPENDENT_AMBULATORY_CARE_PROVIDER_SITE_OTHER): Payer: 59 | Admitting: Family Medicine

## 2011-08-19 ENCOUNTER — Encounter: Payer: Self-pay | Admitting: Family Medicine

## 2011-08-19 VITALS — BP 122/84 | HR 68 | Temp 98.8°F | Ht 69.0 in | Wt 293.5 lb

## 2011-08-19 DIAGNOSIS — J208 Acute bronchitis due to other specified organisms: Secondary | ICD-10-CM

## 2011-08-19 DIAGNOSIS — J209 Acute bronchitis, unspecified: Secondary | ICD-10-CM

## 2011-08-19 MED ORDER — GUAIFENESIN-CODEINE 100-10 MG/5ML PO SYRP: 5.0000 mL | ORAL_SOLUTION | Freq: Four times a day (QID) | ORAL | Status: DC | PRN

## 2011-08-19 MED ORDER — BENZONATATE 200 MG PO CAPS: 200.0000 mg | ORAL_CAPSULE | Freq: Two times a day (BID) | ORAL | Status: AC | PRN

## 2011-08-19 NOTE — Telephone Encounter (Signed)
Will await her evaluation 

## 2011-08-19 NOTE — Patient Instructions (Signed)
I think you have viral bronchitis  Drink extra fluids and try to get extra rest  Take tessalon up to three times daily for cough At night - you can take the cough syrup with codeine  Use inhaler when needed  If worse or increased wheeze- let me know

## 2011-08-19 NOTE — Telephone Encounter (Signed)
Caller: Lindsey Walls/Patient; PCP: Lindsey Abide I.; CB#: [phone];  Call regarding Cough/Congestion; Onset 08/14/11.  Has taken Mucinex and Alka-Seltzer Plus at night.  Emergent sx ruled out.  See provider in 24 hours per Cough protocol.  Appt. with Dr. Milinda Antis at 12:30 as caller requested lunchtime appt and none available this date with PCP.

## 2011-08-19 NOTE — Progress Notes (Signed)
Subjective:    Patient ID: Lindsey Walls, female    DOB: 12/27/1970, 41 y.o.   MRN: [id number]  HPI Glenford Peers symtpoms  Started wed with ST then turned into a full blown cold  Hoarse and coughing and congestion   Clear nasal discharge  Non prod cough  Barky harsh cough   No fever  No sinus pain   mucinex- day Alka selzer cold at night  No nasal spray  Has been using albuterol inhaler for mild night time wheeze   Patient Active Problem List  Diagnoses  . HYPERTENSION  . HYPERLIPIDEMIA  . SKIN TAG  . Strep tonsillitis  . Viral bronchitis   Past Medical History  Diagnosis Date  . Hypertension   . Hyperlipidemia   . Obesity    Past Surgical History  Procedure Date  . Cesarean section   . Tibia fracture surgery   . Cholecystectomy   . Endometrial ablation 08/08   History  Substance Use Topics  . Smoking status: Never Smoker   . Smokeless tobacco: Never Used  . Alcohol Use: No   Family History  Problem Relation Age of Onset  . Coronary artery disease Mother   . Hypertension Father   . Coronary artery disease Sister    No Known Allergies Current Outpatient Prescriptions on File Prior to Visit  Medication Sig Dispense Refill  . albuterol (PROVENTIL HFA;VENTOLIN HFA) 108 (90 BASE) MCG/ACT inhaler Inhale 2 puffs into the lungs every 6 (six) hours as needed.  18 g  1  . HYDROcodone-acetaminophen (NORCO) 5-325 MG per tablet Take 1 tablet by mouth 3 (three) times daily as needed for pain.  30 tablet  0  . lisinopril-hydrochlorothiazide (PRINZIDE,ZESTORETIC) 10-12.5 MG per tablet TAKE 1 TABLET BY MOUTH ONCE DAILY  30 tablet  11  . naproxen (NAPROSYN) 500 MG tablet TAKE 1 TABLET BY MOUTH TWICE A DAY WITH FOOD  60 tablet  1      Review of Systems Review of Systems  Constitutional: Negative for fever, appetite change, fatigue and unexpected weight change.  Eyes: Negative for pain and visual disturbance.  ENt pos for cong/ runny nose/ st/neg for sinus pain   Respiratory: Negative for sob , pos for cough/ mild wheeze  Cardiovascular: Negative for cp or palpitations    Gastrointestinal: Negative for nausea, diarrhea and constipation.  Genitourinary: Negative for urgency and frequency.  Skin: Negative for pallor or rash   Neurological: Negative for weakness, light-headedness, numbness and headaches.  Hematological: Negative for adenopathy. Does not bruise/bleed easily.  Psychiatric/Behavioral: Negative for dysphoric mood. The patient is not nervous/anxious.         Objective:   Physical Exam  Constitutional: She appears well-developed and well-nourished. No distress.       Obese and well appearing   HENT:  Head: Normocephalic and atraumatic.  Mouth/Throat: Oropharynx is clear and moist.  Eyes: Conjunctivae and EOM are normal. Pupils are equal, round, and reactive to light. No scleral icterus.  Neck: Normal range of motion. Neck supple. Carotid bruit is not present. No thyromegaly present.  Cardiovascular: Normal rate, regular rhythm and normal heart sounds.  Exam reveals no gallop.   Pulmonary/Chest: Effort normal and breath sounds normal. No respiratory distress. She has no wheezes. She exhibits no tenderness.       Harsh bs with barky cough No wheeze   Abdominal: Bowel sounds are normal.  Lymphadenopathy:    She has no cervical adenopathy.  Neurological: She is alert.  Skin: Skin is  warm and dry. No rash noted.  Psychiatric: She has a normal mood and affect.          Assessment & Plan:

## 2011-08-19 NOTE — Assessment & Plan Note (Signed)
Acute with dry cough/ no fever or sinus pain/ and reassuring exam  Disc symptomatic care - see instructions on AVS  Will try tessalon for daytime cough Robitussin ac for night  Fluids/ rest  If wheeze- will call  Update if not starting to improve in a week or if worsening

## 2011-10-14 ENCOUNTER — Other Ambulatory Visit: Payer: Self-pay | Admitting: Internal Medicine

## 2011-10-15 NOTE — Telephone Encounter (Signed)
Okay #30 x 0 Have her set up a physical in the next few months---looks like it was last 2/12

## 2011-10-15 NOTE — Telephone Encounter (Signed)
rx called into pharmacy Spoke with patient and advised results   

## 2011-11-01 ENCOUNTER — Other Ambulatory Visit: Payer: Self-pay | Admitting: Internal Medicine

## 2011-11-13 ENCOUNTER — Telehealth: Payer: Self-pay | Admitting: Internal Medicine

## 2011-11-13 NOTE — Telephone Encounter (Signed)
Caller: Tasnia/Patient; PCP: Tillman Abide; CB#: [phone];  Call regarding Injury/Trauma; Fell on left knee 2 weeks ago.  Has been able to work and ambulate.  Bruising is improving but it continues to have swelling in the front of the knee and now down into ankle.  Improves with rest and elevation.  Using ice and Aleve.  Per Knee Injury Protocol all emergent symptoms ruled out with exception of "New or worsening swelling or pain for at least 24 hours that has not improved with home care."  Advised appointment within 24 hrs.  Dr. Alphonsus Sias appointments full, patient prefers to see him.  Will see u/c for evaluation.  Home care advice given.

## 2011-11-14 NOTE — Telephone Encounter (Signed)
Please check on her If she bruised her knee, sometimes gravity will have some of the blood go down her leg to the foot and ankle. That alone is not reason for visit Offer appt tomorrow afternoon if she needs to be seen and hasn't gone to urgent care

## 2011-11-14 NOTE — Telephone Encounter (Signed)
Spoke with patient and scheduled appt for 11/15/11, per pt the bruising is better but the swelling is worse and she has a knot that won't go down.

## 2011-11-15 ENCOUNTER — Ambulatory Visit (INDEPENDENT_AMBULATORY_CARE_PROVIDER_SITE_OTHER): Payer: 59 | Admitting: Internal Medicine

## 2011-11-15 ENCOUNTER — Encounter: Payer: Self-pay | Admitting: Internal Medicine

## 2011-11-15 ENCOUNTER — Encounter: Payer: Self-pay | Admitting: *Deleted

## 2011-11-15 VITALS — BP 120/82 | HR 88 | Temp 97.8°F | Wt 297.0 lb

## 2011-11-15 DIAGNOSIS — S8000XA Contusion of unspecified knee, initial encounter: Secondary | ICD-10-CM

## 2011-11-15 DIAGNOSIS — S8002XA Contusion of left knee, initial encounter: Secondary | ICD-10-CM

## 2011-11-15 NOTE — Assessment & Plan Note (Signed)
Fairly sizable Now with pooling of blood in ankle and slight tenderness there No signs of bony damage or DVT Some numbness over bruise at knee---should resolve over time Some ankle edema---related to venous changes after the injury? Reassured---no worrisome findings

## 2011-11-15 NOTE — Progress Notes (Signed)
  Subjective:    Patient ID: Lindsey Walls, female    DOB: [birth year], 41 y.o.   MRN: [id number]  HPI Larey Seat down steps 2 weeks ago Missed last step and fell right onto left knee Shows me pictures of significant swelling and bruising Slowly resolving in knee Can walk on it Pain is mild---using aleve  Concerned because of spread to blood pooling in ankle Still swelling in knee which worries her  Current Outpatient Prescriptions on File Prior to Visit  Medication Sig Dispense Refill  . albuterol (PROVENTIL HFA;VENTOLIN HFA) 108 (90 BASE) MCG/ACT inhaler Inhale 2 puffs into the lungs every 6 (six) hours as needed.  18 g  1  . HYDROcodone-acetaminophen (NORCO) 5-325 MG per tablet Take 1 tablet by mouth 3 (three) times daily as needed for pain.  30 tablet  0  . lisinopril-hydrochlorothiazide (PRINZIDE,ZESTORETIC) 10-12.5 MG per tablet TAKE 1 TABLET BY MOUTH ONCE DAILY  30 tablet  11  . naproxen (NAPROSYN) 500 MG tablet TAKE 1 TABLET BY MOUTH TWICE A DAY WITH FOOD  60 tablet  1    No Known Allergies  Past Medical History  Diagnosis Date  . Hypertension   . Hyperlipidemia   . Obesity     Past Surgical History  Procedure Date  . Cesarean section   . Tibia fracture surgery   . Cholecystectomy   . Endometrial ablation 08/08    Family History  Problem Relation Age of Onset  . Coronary artery disease Mother   . Hypertension Father   . Coronary artery disease Sister     History   Social History  . Marital Status: Legally Separated    Spouse Name: N/A    Number of Children: 2  . Years of Education: N/A   Occupational History  . Counsellor    Social History Main Topics  . Smoking status: Never Smoker   . Smokeless tobacco: Never Used  . Alcohol Use: No  . Drug Use: No  . Sexually Active: Not on file   Other Topics Concern  . Not on file   Social History Narrative  . No narrative on file   Review of Systems Edema in evening--gone in AM Starts swelling  fairly quickly No chest pain  No SOB     Objective:   Physical Exam  Constitutional: She appears well-developed and well-nourished. No distress.  Musculoskeletal:       Moderate swelling in front of left knee but no obvious effusion Bruising seen but not tender No meniscus signs No instability  Left ankle has mild edema Blood pooled medially at the ankle with slight tenderness Normal ROM though          Assessment & Plan:

## 2011-12-04 ENCOUNTER — Other Ambulatory Visit: Payer: Self-pay | Admitting: Internal Medicine

## 2012-01-01 ENCOUNTER — Other Ambulatory Visit: Payer: Self-pay | Admitting: Obstetrics and Gynecology

## 2012-01-01 DIAGNOSIS — R928 Other abnormal and inconclusive findings on diagnostic imaging of breast: Secondary | ICD-10-CM

## 2012-01-02 ENCOUNTER — Ambulatory Visit
Admission: RE | Admit: 2012-01-02 | Discharge: 2012-01-02 | Disposition: A | Discharge status: Home / Self Care | Payer: 59 | Source: Ambulatory Visit | Attending: Obstetrics and Gynecology | Admitting: Obstetrics and Gynecology

## 2012-01-02 DIAGNOSIS — R928 Other abnormal and inconclusive findings on diagnostic imaging of breast: Secondary | ICD-10-CM

## 2012-02-07 ENCOUNTER — Encounter: Payer: Self-pay | Admitting: *Deleted

## 2012-02-07 ENCOUNTER — Ambulatory Visit (INDEPENDENT_AMBULATORY_CARE_PROVIDER_SITE_OTHER): Payer: 59 | Admitting: Internal Medicine

## 2012-02-07 ENCOUNTER — Encounter: Payer: Self-pay | Admitting: Internal Medicine

## 2012-02-07 VITALS — BP 118/86 | HR 82 | Temp 98.8°F | Ht 65.5 in | Wt 299.5 lb

## 2012-02-07 DIAGNOSIS — I10 Essential (primary) hypertension: Secondary | ICD-10-CM

## 2012-02-07 DIAGNOSIS — Z0000 Encounter for general adult medical examination without abnormal findings: Secondary | ICD-10-CM | POA: Insufficient documentation

## 2012-02-07 DIAGNOSIS — E785 Hyperlipidemia, unspecified: Secondary | ICD-10-CM

## 2012-02-07 DIAGNOSIS — M549 Dorsalgia, unspecified: Secondary | ICD-10-CM

## 2012-02-07 DIAGNOSIS — Z23 Encounter for immunization: Secondary | ICD-10-CM

## 2012-02-07 DIAGNOSIS — G8929 Other chronic pain: Secondary | ICD-10-CM | POA: Insufficient documentation

## 2012-02-07 DIAGNOSIS — E669 Obesity, unspecified: Secondary | ICD-10-CM

## 2012-02-07 LAB — LIPID PANEL
Cholesterol: 226 mg/dL — ABNORMAL HIGH (ref 0–200)
HDL: 51.9 mg/dL (ref >39.00)
Total CHOL/HDL Ratio: 4
Triglycerides: 106 mg/dL (ref 0.0–149.0)
VLDL: 21.2 mg/dL (ref 0.0–40.0)

## 2012-02-07 LAB — CBC WITH DIFFERENTIAL/PLATELET
Basophils Relative: 0.1 % (ref 0.0–3.0)
Eosinophils Absolute: 0.2 10*3/uL (ref 0.0–0.7)
Eosinophils Relative: 2 % (ref 0.0–5.0)
Lymphocytes Relative: 26 % (ref 12.0–46.0)
MCHC: 31 g/dL (ref 30.0–36.0)
Neutrophils Relative %: 63 % (ref 43.0–77.0)
RBC: 4.83 Mil/uL (ref 3.87–5.11)
WBC: 10.7 10*3/uL — ABNORMAL HIGH (ref 4.5–10.5)

## 2012-02-07 LAB — HEPATIC FUNCTION PANEL
ALT: 28 U/L (ref 0–35)
Bilirubin, Direct: 0 mg/dL (ref 0.0–0.3)
Total Bilirubin: 0.4 mg/dL (ref 0.3–1.2)

## 2012-02-07 LAB — BASIC METABOLIC PANEL
Calcium: 9.1 mg/dL (ref 8.4–10.5)
Creatinine, Ser: 0.7 mg/dL (ref 0.4–1.2)
GFR: 121.48 mL/min (ref >60.00)

## 2012-02-07 NOTE — Assessment & Plan Note (Signed)
Discussed cutting out fast food

## 2012-02-07 NOTE — Assessment & Plan Note (Signed)
Healthy UTD with gyn Tdap today

## 2012-02-07 NOTE — Assessment & Plan Note (Signed)
BP Readings from Last 3 Encounters:  02/07/12 118/86  11/15/11 120/82  08/19/11 122/84   Good control No changes Due for labs

## 2012-02-07 NOTE — Assessment & Plan Note (Signed)
Discussed primary prevention---she doesn't want meds Discussed lifestyle

## 2012-02-07 NOTE — Progress Notes (Signed)
Subjective:    Patient ID: Lindsey Walls, female    DOB: [birth year], 41 y.o.   MRN: [id number]  HPI Here for physical Doing great New boyfriend and relationship for 5 months Dad with new spot on lung---undergoing evaluation now  Just saw gyn in August Last pap normal in August also Sees Dr Joanette Gula  Uses hydrocodone to help sleep---relieves back pain Uses tramadol during day for menstrual pain Naproxen not that much help for her back but uses it at times  Has not been working on fitness Tries to avoid sweet  Current Outpatient Prescriptions on File Prior to Visit  Medication Sig Dispense Refill  . albuterol (PROVENTIL HFA;VENTOLIN HFA) 108 (90 BASE) MCG/ACT inhaler Inhale 2 puffs into the lungs every 6 (six) hours as needed.  18 g  1  . HYDROcodone-acetaminophen (NORCO) 5-325 MG per tablet Take 1 tablet by mouth 3 (three) times daily as needed for pain.  30 tablet  0  . lisinopril-hydrochlorothiazide (PRINZIDE,ZESTORETIC) 10-12.5 MG per tablet TAKE 1 TABLET BY MOUTH ONCE DAILY  30 tablet  11  . traMADol (ULTRAM) 50 MG tablet take 1 to 2 tablets by mouth every 4 to 6 hours if needed for pain  60 tablet  0    No Known Allergies  Past Medical History  Diagnosis Date  . Hypertension   . Hyperlipidemia   . Obesity   . Chronic back pain   . Obesity     Past Surgical History  Procedure Date  . Cesarean section   . Tibia fracture surgery   . Cholecystectomy   . Endometrial ablation 08/08    Family History  Problem Relation Age of Onset  . Coronary artery disease Mother   . Hypertension Father   . Coronary artery disease Sister     History   Social History  . Marital Status: Legally Separated    Spouse Name: N/A    Number of Children: 2  . Years of Education: N/A   Occupational History  . Counsellor    Social History Main Topics  . Smoking status: Never Smoker   . Smokeless tobacco: Never Used  . Alcohol Use: No  . Drug Use: No  . Sexually  Active: Not on file   Other Topics Concern  . Not on file   Social History Narrative  . No narrative on file   Review of Systems  Constitutional: Negative for fatigue and unexpected weight change.       Wears seat belt  HENT: Positive for congestion and rhinorrhea. Negative for hearing loss, dental problem and tinnitus.        Due for dentist Mild allergy symptoms---uses mucinex  Eyes: Negative for visual disturbance.       No diplopia or unilateral vision loss  Respiratory: Negative for cough, chest tightness and shortness of breath.        Noisy breathing at night  Cardiovascular: Negative for chest pain, palpitations and leg swelling.       Swelling from knee injury is gone  Gastrointestinal: Negative for nausea, vomiting, abdominal pain, constipation and blood in stool.       No heartburn  Genitourinary: Positive for urgency. Negative for difficulty urinating and dyspareunia.       Occ urgency but no incontinence Periods are now regular again--needed progesterone challenge  Musculoskeletal: Positive for back pain and arthralgias. Negative for joint swelling.       Rare hand pain--mostly in back only  Skin: Negative for rash.  No suspicious lesions  Neurological: Negative for dizziness, syncope, weakness, light-headedness, numbness and headaches.  Hematological: Negative for adenopathy. Does not bruise/bleed easily.  Psychiatric/Behavioral: Negative for disturbed wake/sleep cycle and dysphoric mood. The patient is not nervous/anxious.        Objective:   Physical Exam  Constitutional: She is oriented to person, place, and time. She appears well-developed and well-nourished. No distress.  HENT:  Head: Normocephalic and atraumatic.  Right Ear: External ear normal.  Left Ear: External ear normal.  Mouth/Throat: Oropharynx is clear and moist. No oropharyngeal exudate.  Eyes: Conjunctivae normal and EOM are normal. Pupils are equal, round, and reactive to light.  Neck:  Normal range of motion. Neck supple. No thyromegaly present.  Cardiovascular: Normal rate, regular rhythm, normal heart sounds and intact distal pulses.  Exam reveals no gallop.   No murmur heard. Pulmonary/Chest: Effort normal and breath sounds normal. No respiratory distress. She has no wheezes. She has no rales.  Abdominal: Soft. There is no tenderness.  Musculoskeletal: She exhibits no edema and no tenderness.  Lymphadenopathy:    She has no cervical adenopathy.  Neurological: She is alert and oriented to person, place, and time.  Skin: No rash noted. No erythema.  Psychiatric: She has a normal mood and affect. Her behavior is normal. Thought content normal.          Assessment & Plan:

## 2012-02-07 NOTE — Assessment & Plan Note (Signed)
Uses the hydrocodone nightly Tramadol occ for daytime menstrual pain

## 2012-02-07 NOTE — Addendum Note (Signed)
Addended by: Shon Millet on: 02/07/2012 09:33 AM   Modules accepted: Orders

## 2012-02-09 ENCOUNTER — Other Ambulatory Visit: Payer: Self-pay | Admitting: Internal Medicine

## 2012-02-09 ENCOUNTER — Encounter: Payer: Self-pay | Admitting: Internal Medicine

## 2012-02-10 MED ORDER — TRAMADOL HCL 50 MG PO TABS: 50.0000 mg | ORAL_TABLET | Freq: Four times a day (QID) | ORAL | Status: DC | PRN

## 2012-03-04 ENCOUNTER — Other Ambulatory Visit: Payer: Self-pay | Admitting: Internal Medicine

## 2012-04-10 ENCOUNTER — Other Ambulatory Visit: Payer: Self-pay | Admitting: Internal Medicine

## 2012-05-22 ENCOUNTER — Encounter: Payer: 59 | Admitting: Internal Medicine

## 2012-05-23 ENCOUNTER — Other Ambulatory Visit: Payer: Self-pay

## 2012-07-02 ENCOUNTER — Encounter: Payer: Self-pay | Admitting: *Deleted

## 2012-07-02 ENCOUNTER — Encounter: Payer: Self-pay | Admitting: Family Medicine

## 2012-07-02 ENCOUNTER — Ambulatory Visit (INDEPENDENT_AMBULATORY_CARE_PROVIDER_SITE_OTHER): Payer: 59 | Admitting: Family Medicine

## 2012-07-02 ENCOUNTER — Telehealth: Payer: Self-pay | Admitting: Internal Medicine

## 2012-07-02 VITALS — BP 128/82 | HR 74 | Temp 98.3°F | Wt 306.0 lb

## 2012-07-02 DIAGNOSIS — R300 Dysuria: Secondary | ICD-10-CM

## 2012-07-02 DIAGNOSIS — N390 Urinary tract infection, site not specified: Secondary | ICD-10-CM

## 2012-07-02 LAB — POCT URINALYSIS DIPSTICK
Bilirubin, UA: NEGATIVE
Ketones, UA: NEGATIVE
pH, UA: 6

## 2012-07-02 MED ORDER — SULFAMETHOXAZOLE-TMP DS 800-160 MG PO TABS: 1.0000 | ORAL_TABLET | Freq: Two times a day (BID) | ORAL | Status: DC

## 2012-07-02 NOTE — Patient Instructions (Signed)
Push fluids, start and complete antibiotics.  Call if symptoms not improved.

## 2012-07-02 NOTE — Progress Notes (Signed)
  Subjective:    Patient ID: Lindsey Walls, female    DOB: 11-11-1970, 42 y.o.   MRN: [id number]  Dysuria  This is a new problem. The current episode started in the past 7 days (2 days ago). The problem occurs intermittently. The problem has been waxing and waning. The pain is moderate. There has been no fever. She is sexually active (new partner). There is no history of pyelonephritis. Associated symptoms include frequency and urgency. Pertinent negatives include no chills, discharge, flank pain, nausea or vomiting. Associated symptoms comments: Started menses last week, very light, stopped 2 days ago. She has tried increased fluids (cranberry juice) for the symptoms. The treatment provided mild relief. There is no history of catheterization, kidney stones, a single kidney or a urological procedure.      Review of Systems  Constitutional: Negative for chills.  Gastrointestinal: Negative for nausea and vomiting.  Genitourinary: Positive for dysuria, urgency and frequency. Negative for flank pain.       Objective:   Physical Exam  Constitutional: She appears well-developed.  HENT:  Head: Normocephalic.  Eyes: Conjunctivae are normal. Pupils are equal, round, and reactive to light.  Neck: Normal range of motion. Neck supple.  Cardiovascular: Normal rate and regular rhythm.  Exam reveals no gallop and no friction rub.   No murmur heard. Pulmonary/Chest: Effort normal and breath sounds normal. No respiratory distress.  Abdominal: Soft. Bowel sounds are normal. There is no tenderness. There is no CVA tenderness.  Neurological: She is alert.  Psychiatric: She has a normal mood and affect.          Assessment & Plan:

## 2012-07-02 NOTE — Telephone Encounter (Signed)
Patient Information:  Caller Name: Deijah  Phone: [phone]  Patient: Lindsey Walls, Lindsey Walls  Gender: Female  DOB: 09/20/1970  Age: 43 Years  PCP: Tillman Abide [facility])  Pregnant: No  Office Follow Up:  Does the office need to follow up with this patient?: No  Instructions For The Office: N/A   Symptoms  Reason For Call & Symptoms: Urinary frequency x 48 hour and painful urination since 3/26.  Reviewed Health History In EMR: Yes  Reviewed Medications In EMR: Yes  Reviewed Allergies In EMR: Yes  Reviewed Surgeries / Procedures: Yes  Date of Onset of Symptoms: 06/30/2012 OB / GYN:  LMP: 06/26/2012  Guideline(s) Used:  Urination Pain - Female  Disposition Per Guideline:   See Today in Office  Reason For Disposition Reached:   Painful urination AND EITHER frequency or urgency  Advice Given:  N/A  Patient Will Follow Care Advice:  YES  Appointment Scheduled:  07/02/2012 09:15:00 Appointment Scheduled Provider:  Kerby Nora (Family Practice)

## 2012-07-02 NOTE — Assessment & Plan Note (Signed)
Push fluids, complete antibiotics. Nonsmoker.. Low risk for bladder cancer.

## 2012-09-08 ENCOUNTER — Other Ambulatory Visit: Payer: Self-pay | Admitting: Obstetrics and Gynecology

## 2012-09-30 ENCOUNTER — Telehealth: Payer: Self-pay | Admitting: Internal Medicine

## 2012-09-30 ENCOUNTER — Ambulatory Visit: Payer: Self-pay | Admitting: Family Medicine

## 2012-09-30 NOTE — Telephone Encounter (Signed)
Patient Information:  Caller Name: Bethann  Phone: [phone]  Patient: Lindsey Walls  Gender: Female  DOB: 05/16/1970  Age: 41 Years  PCP: Tillman Abide [facility])  Pregnant: No  Office Follow Up:  Does the office need to follow up with this patient?: No  Instructions For The Office: N/A  RN Note:  States does not feel like asthma attack but is unable to walk through home without stopping to rest due to shortness of breath. Advised UC due to wheezing with cough.  Symptoms  Reason For Call & Symptoms: Has cough since 6-24. Is non productive. Has been using Albuterol inhaler every 1-2 hours 6-25.  Reviewed Health History In EMR: Yes  Reviewed Medications In EMR: Yes  Reviewed Allergies In EMR: Yes  Reviewed Surgeries / Procedures: Yes  Date of Onset of Symptoms: 09/29/2012  Treatments Tried: Albuterol  Treatments Tried Worked: No OB / GYN:  LMP: 09/16/2012  Guideline(s) Used:  Asthma Attack  Cough  Disposition Per Guideline:   Go to Office Now  Reason For Disposition Reached:   Wheezing is present  Advice Given:  N/A  Patient Will Follow Care Advice:  YES

## 2012-10-01 MED ORDER — HYDROCODONE-HOMATROPINE 5-1.5 MG/5ML PO SYRP: 5.0000 mL | ORAL_SOLUTION | Freq: Every evening | ORAL | Status: DC | PRN

## 2012-10-01 NOTE — Telephone Encounter (Signed)
Okay for cough syrup Please phone in

## 2012-10-01 NOTE — Telephone Encounter (Signed)
rx called into pharmacy

## 2012-10-01 NOTE — Telephone Encounter (Signed)
Please check on her Can add on at 4:45 if she needs to be seen

## 2012-10-01 NOTE — Telephone Encounter (Signed)
Spoke with patient and she went to urgent care diagnosed with URI, she has abx but states her cough is the worse at night, pt would like cough syrup called in if possible. Please advise

## 2012-10-14 ENCOUNTER — Encounter (HOSPITAL_COMMUNITY): Payer: Self-pay | Admitting: Pharmacist

## 2012-10-23 ENCOUNTER — Other Ambulatory Visit (HOSPITAL_COMMUNITY): Payer: 59

## 2012-11-17 ENCOUNTER — Encounter (HOSPITAL_COMMUNITY)
Admission: RE | Admit: 2012-11-17 | Discharge: 2012-11-17 | Disposition: A | Discharge status: Home / Self Care | Payer: Managed Care, Other (non HMO) | Source: Ambulatory Visit | Attending: Obstetrics and Gynecology | Admitting: Obstetrics and Gynecology

## 2012-11-17 ENCOUNTER — Inpatient Hospital Stay (HOSPITAL_COMMUNITY): Admission: RE | Admit: 2012-11-17 | Payer: 59 | Source: Ambulatory Visit

## 2012-11-17 ENCOUNTER — Other Ambulatory Visit: Payer: Self-pay

## 2012-11-17 ENCOUNTER — Encounter (HOSPITAL_COMMUNITY): Payer: Self-pay

## 2012-11-17 DIAGNOSIS — Z01818 Encounter for other preprocedural examination: Secondary | ICD-10-CM | POA: Insufficient documentation

## 2012-11-17 DIAGNOSIS — Z01812 Encounter for preprocedural laboratory examination: Secondary | ICD-10-CM | POA: Insufficient documentation

## 2012-11-17 DIAGNOSIS — Z01810 Encounter for preprocedural cardiovascular examination: Secondary | ICD-10-CM | POA: Insufficient documentation

## 2012-11-17 HISTORY — DX: Anemia, unspecified: D64.9

## 2012-11-17 HISTORY — DX: Unspecified chronic bronchitis: J42

## 2012-11-17 LAB — CBC
HCT: 37.3 % (ref 36.0–46.0)
Hemoglobin: 12.2 g/dL (ref 12.0–15.0)
WBC: 11.9 10*3/uL — ABNORMAL HIGH (ref 4.0–10.5)

## 2012-11-17 LAB — BASIC METABOLIC PANEL
BUN: 11 mg/dL (ref 6–23)
CO2: 28 mEq/L (ref 19–32)
Chloride: 103 mEq/L (ref 96–112)
Glucose, Bld: 83 mg/dL (ref 70–99)
Potassium: 3.6 mEq/L (ref 3.5–5.1)

## 2012-11-17 NOTE — Patient Instructions (Signed)
Your procedure is scheduled on:11/23/12  Enter through the Main Entrance at :1015 am Pick up desk phone and dial [phone] and inform us of your arrival.  Please call [phone] if you have any problems the morning of surgery.  Remember: Do not eat food after midnight:SUNDAY Clear liquids are ok until 7am MONDAY   You may brush your teeth the morning of surgery  DO NOT wear jewelry, eye make-up, lipstick,body lotion, or dark fingernail polish.  (Polished toes are ok) You may wear deodorant.  If you are to be admitted after surgery, leave suitcase in car until your room has been assigned. Patients discharged on the day of surgery will not be allowed to drive home. Wear loose fitting, comfortable clothes for your ride home.

## 2012-11-22 ENCOUNTER — Other Ambulatory Visit: Payer: Self-pay | Admitting: Obstetrics and Gynecology

## 2012-11-22 NOTE — H&P (Signed)
42 y.o. yo complains of menometrarrhagia and symptomatic fibroid uterus.  Pt had annual in 09-13. She had previously successful Novasure done 2008. The periods were regular flow - she started missing months a couple years ago. No PMP sx. No IM. Last month stayed on for whole 2 weeks. Went off one day and returned quickly. Had excessive bleeding a day before office then nothing else since. No pain or cramping.Ultrasound showed an enlarging uterus with multiple fibroids, one of which has grown to 5 cm.  She desires definitive therapy.  Past Medical History  Diagnosis Date  . Hypertension   . Hyperlipidemia   . Obesity   . Chronic back pain   . Obesity   . Bronchitis, chronic   . Anemia    Past Surgical History  Procedure Laterality Date  . Cesarean section    . Tibia fracture surgery    . Cholecystectomy    . Endometrial ablation  08/08    History   Social History  . Marital Status: Divorced    Spouse Name: N/A    Number of Children: 2  . Years of Education: N/A   Occupational History  . Counsellor    Social History Main Topics  . Smoking status: Never Smoker   . Smokeless tobacco: Never Used  . Alcohol Use: No  . Drug Use: No  . Sexual Activity: Not on file   Other Topics Concern  . Not on file   Social History Narrative  . No narrative on file    No current facility-administered medications on file prior to encounter.   Current Outpatient Prescriptions on File Prior to Encounter  Medication Sig Dispense Refill  . albuterol (PROVENTIL HFA;VENTOLIN HFA) 108 (90 BASE) MCG/ACT inhaler Inhale 2 puffs into the lungs every 6 (six) hours as needed.  18 g  1  . HYDROcodone-acetaminophen (NORCO) 5-325 MG per tablet Take 1 tablet by mouth 3 (three) times daily as needed for pain.  30 tablet  0  . lisinopril-hydrochlorothiazide (PRINZIDE,ZESTORETIC) 10-12.5 MG per tablet Take 1 tablet by mouth daily.  90 tablet  3    No Known Allergies  @VITALS2 @  Lungs: clear  to ascultation Cor:  RRR Abdomen:  soft, nontender, nondistended. Ex:  no cords, erythema Pelvic:  Vulva: no masses, atrophy, or lesions. Bladder/Urethra: no urethral discharge or mass and normal meatus and bladder non distended. Vagina no tenderness, erythema, cystocele, rectocele, abnormal vaginal discharge, or vesicle(s) or ulcers. Cervix: no discharge or cervical motion tenderness and grossly normal. Uterus: normal shape and size (11weeks) and midline, mobile, non-tender, and no uterine prolapse. Adnexa/Parametria: no parametrial tenderness or mass and no adnexal tenderness or ovarian mass.  US uterus 11x7x8.5, multiple fibroids without atypical appearance, largest of which is 5 cm and posterior.  Ovaries were not seen.  EM was 1 cm.  EMB minute fragments of endometrium without atypia or hyperplasia.   A:  For Robotic TLH/ Salpingectomies/ possible morcellation either manually or power morcellation/possible open/possible BSO.   P:   All risks, benefits and alternatives d/w patient and she desires to proceed.  Also d/w patient the likelihood of manual morcellation through the vagina and the possibility of power morcellation if necessary to continue the operation minimally invasively.  Patient understands small risk of 1:300-500 of sarcoma and possibility of spread in that case. She voices understanding and wishes to proceed. Patient has undergone a modified bowel prep and will receive preop antibiotics and SCDs during the operation.  Ece Cumberland A

## 2012-11-23 ENCOUNTER — Encounter (HOSPITAL_COMMUNITY): Payer: Self-pay | Admitting: Anesthesiology

## 2012-11-23 ENCOUNTER — Ambulatory Visit (HOSPITAL_COMMUNITY): Payer: Managed Care, Other (non HMO) | Admitting: Anesthesiology

## 2012-11-23 ENCOUNTER — Encounter (HOSPITAL_COMMUNITY)
Admission: RE | Disposition: A | Discharge status: Home / Self Care | Payer: Self-pay | Source: Ambulatory Visit | Attending: Obstetrics and Gynecology

## 2012-11-23 ENCOUNTER — Ambulatory Visit (HOSPITAL_COMMUNITY)
Admission: RE | Admit: 2012-11-23 | Discharge: 2012-11-25 | DRG: 743 | Disposition: A | Discharge status: Home / Self Care | Payer: Managed Care, Other (non HMO) | Source: Ambulatory Visit | Attending: Obstetrics and Gynecology | Admitting: Obstetrics and Gynecology

## 2012-11-23 DIAGNOSIS — K668 Other specified disorders of peritoneum: Secondary | ICD-10-CM | POA: Diagnosis present

## 2012-11-23 DIAGNOSIS — I10 Essential (primary) hypertension: Secondary | ICD-10-CM | POA: Diagnosis present

## 2012-11-23 DIAGNOSIS — N83209 Unspecified ovarian cyst, unspecified side: Secondary | ICD-10-CM | POA: Diagnosis present

## 2012-11-23 DIAGNOSIS — N838 Other noninflammatory disorders of ovary, fallopian tube and broad ligament: Secondary | ICD-10-CM | POA: Diagnosis present

## 2012-11-23 DIAGNOSIS — E785 Hyperlipidemia, unspecified: Secondary | ICD-10-CM | POA: Diagnosis present

## 2012-11-23 DIAGNOSIS — E669 Obesity, unspecified: Secondary | ICD-10-CM | POA: Diagnosis present

## 2012-11-23 DIAGNOSIS — Z9889 Other specified postprocedural states: Secondary | ICD-10-CM

## 2012-11-23 DIAGNOSIS — N920 Excessive and frequent menstruation with regular cycle: Secondary | ICD-10-CM | POA: Diagnosis present

## 2012-11-23 DIAGNOSIS — Z5331 Laparoscopic surgical procedure converted to open procedure: Secondary | ICD-10-CM

## 2012-11-23 DIAGNOSIS — D252 Subserosal leiomyoma of uterus: Secondary | ICD-10-CM | POA: Diagnosis present

## 2012-11-23 DIAGNOSIS — D250 Submucous leiomyoma of uterus: Secondary | ICD-10-CM | POA: Diagnosis present

## 2012-11-23 DIAGNOSIS — D251 Intramural leiomyoma of uterus: Secondary | ICD-10-CM | POA: Diagnosis present

## 2012-11-23 HISTORY — PX: ABDOMINAL HYSTERECTOMY: SHX81

## 2012-11-23 HISTORY — PX: ROBOTIC ASSISTED TOTAL HYSTERECTOMY: SHX6085

## 2012-11-23 HISTORY — PX: OMENTECTOMY: SHX5985

## 2012-11-23 HISTORY — PX: CYSTOSCOPY: SHX5120

## 2012-11-23 SURGERY — ROBOTIC ASSISTED TOTAL HYSTERECTOMY
Anesthesia: General | Site: Urethra | Wound class: Clean Contaminated

## 2012-11-23 MED ORDER — MORPHINE SULFATE (PF) 1 MG/ML IV SOLN
INTRAVENOUS | Status: DC
  Administered 2012-11-23: 23:00:00 via INTRAVENOUS
  Administered 2012-11-23: 16 mg via INTRAVENOUS
  Administered 2012-11-24: 9 mg via INTRAVENOUS
  Administered 2012-11-24: 14 mg via INTRAVENOUS
  Administered 2012-11-24: 5 mg via INTRAVENOUS
  Filled 2012-11-23 (×2): qty 25

## 2012-11-23 MED ORDER — ROCURONIUM BROMIDE 100 MG/10ML IV SOLN
INTRAVENOUS | Status: DC | PRN
  Administered 2012-11-23: 20 mg via INTRAVENOUS
  Administered 2012-11-23: 5 mg via INTRAVENOUS
  Administered 2012-11-23: 10 mg via INTRAVENOUS
  Administered 2012-11-23: 40 mg via INTRAVENOUS

## 2012-11-23 MED ORDER — ALBUTEROL SULFATE HFA 108 (90 BASE) MCG/ACT IN AERS: 2.0000 | INHALATION_SPRAY | Freq: Four times a day (QID) | RESPIRATORY_TRACT | Status: DC | PRN

## 2012-11-23 MED ORDER — DEXTROSE 5 % IV SOLN
3.0000 g | INTRAVENOUS | Status: AC
  Administered 2012-11-23: 3 g via INTRAVENOUS
  Filled 2012-11-23: qty 3000

## 2012-11-23 MED ORDER — SODIUM CHLORIDE 0.9 % IJ SOLN: 9.0000 mL | INTRAMUSCULAR | Status: DC | PRN

## 2012-11-23 MED ORDER — NEOSTIGMINE METHYLSULFATE 1 MG/ML IJ SOLN
INTRAMUSCULAR | Status: DC | PRN
  Administered 2012-11-23: 2 mg via INTRAVENOUS

## 2012-11-23 MED ORDER — ZOLPIDEM TARTRATE 5 MG PO TABS: 5.0000 mg | ORAL_TABLET | Freq: Every evening | ORAL | Status: DC | PRN

## 2012-11-23 MED ORDER — METHYLENE BLUE 1 % INJ SOLN
INTRAMUSCULAR | Status: DC | PRN
  Administered 2012-11-23: 1 mL

## 2012-11-23 MED ORDER — PNEUMOCOCCAL VAC POLYVALENT 25 MCG/0.5ML IJ INJ
0.5000 mL | INJECTION | INTRAMUSCULAR | Status: DC
  Filled 2012-11-23: qty 0.5

## 2012-11-23 MED ORDER — HYDROMORPHONE HCL PF 1 MG/ML IJ SOLN
0.2500 mg | INTRAMUSCULAR | Status: DC | PRN
  Administered 2012-11-23: 0.5 mg via INTRAVENOUS

## 2012-11-23 MED ORDER — ONDANSETRON HCL 4 MG PO TABS: 4.0000 mg | ORAL_TABLET | Freq: Four times a day (QID) | ORAL | Status: DC | PRN

## 2012-11-23 MED ORDER — EPHEDRINE SULFATE 50 MG/ML IJ SOLN
INTRAMUSCULAR | Status: DC | PRN
  Administered 2012-11-23 (×2): 10 mg via INTRAVENOUS

## 2012-11-23 MED ORDER — PROPOFOL 10 MG/ML IV BOLUS
INTRAVENOUS | Status: DC | PRN
  Administered 2012-11-23: 200 mg via INTRAVENOUS

## 2012-11-23 MED ORDER — LACTATED RINGERS IV SOLN
INTRAVENOUS | Status: DC
  Administered 2012-11-23 (×4): via INTRAVENOUS

## 2012-11-23 MED ORDER — LISINOPRIL-HYDROCHLOROTHIAZIDE 10-12.5 MG PO TABS: 1.0000 | ORAL_TABLET | Freq: Every day | ORAL | Status: DC

## 2012-11-23 MED ORDER — DIPHENHYDRAMINE HCL 12.5 MG/5ML PO ELIX: 12.5000 mg | ORAL_SOLUTION | Freq: Four times a day (QID) | ORAL | Status: DC | PRN

## 2012-11-23 MED ORDER — PROMETHAZINE HCL 25 MG/ML IJ SOLN: 6.2500 mg | INTRAMUSCULAR | Status: DC | PRN

## 2012-11-23 MED ORDER — MEPERIDINE HCL 25 MG/ML IJ SOLN: 6.2500 mg | INTRAMUSCULAR | Status: DC | PRN

## 2012-11-23 MED ORDER — ONDANSETRON HCL 4 MG/2ML IJ SOLN
4.0000 mg | Freq: Four times a day (QID) | INTRAMUSCULAR | Status: DC | PRN
Start: 2012-11-23 — End: 2012-11-24

## 2012-11-23 MED ORDER — LACTATED RINGERS IR SOLN
Status: DC | PRN
  Administered 2012-11-23: 3000 mL

## 2012-11-23 MED ORDER — FENTANYL CITRATE 0.05 MG/ML IJ SOLN
INTRAMUSCULAR | Status: DC | PRN
  Administered 2012-11-23: 100 ug via INTRAVENOUS
  Administered 2012-11-23 (×2): 50 ug via INTRAVENOUS
  Administered 2012-11-23 (×2): 100 ug via INTRAVENOUS
  Administered 2012-11-23: 50 ug via INTRAVENOUS

## 2012-11-23 MED ORDER — ONDANSETRON HCL 4 MG/2ML IJ SOLN
INTRAMUSCULAR | Status: DC | PRN
  Administered 2012-11-23: 4 mg via INTRAVENOUS

## 2012-11-23 MED ORDER — KETOROLAC TROMETHAMINE 30 MG/ML IJ SOLN: 30.0000 mg | Freq: Four times a day (QID) | INTRAMUSCULAR | Status: DC

## 2012-11-23 MED ORDER — GLYCOPYRROLATE 0.2 MG/ML IJ SOLN
INTRAMUSCULAR | Status: DC | PRN
  Administered 2012-11-23: 0.4 mg via INTRAVENOUS

## 2012-11-23 MED ORDER — HYDROCHLOROTHIAZIDE 12.5 MG PO CAPS
12.5000 mg | ORAL_CAPSULE | Freq: Every day | ORAL | Status: DC
  Filled 2012-11-23 (×4): qty 1

## 2012-11-23 MED ORDER — ONDANSETRON HCL 4 MG/2ML IJ SOLN: 4.0000 mg | Freq: Four times a day (QID) | INTRAMUSCULAR | Status: DC | PRN

## 2012-11-23 MED ORDER — STERILE WATER FOR IRRIGATION IR SOLN
Status: DC | PRN
  Administered 2012-11-23: 1000 mL

## 2012-11-23 MED ORDER — KETOROLAC TROMETHAMINE 30 MG/ML IJ SOLN
INTRAMUSCULAR | Status: DC | PRN
  Administered 2012-11-23: 30 mg via INTRAVENOUS

## 2012-11-23 MED ORDER — KETOROLAC TROMETHAMINE 30 MG/ML IJ SOLN: 15.0000 mg | Freq: Once | INTRAMUSCULAR | Status: DC | PRN

## 2012-11-23 MED ORDER — LIDOCAINE HCL (CARDIAC) 20 MG/ML IV SOLN
INTRAVENOUS | Status: DC | PRN
  Administered 2012-11-23: 50 mg via INTRAVENOUS

## 2012-11-23 MED ORDER — MENTHOL 3 MG MT LOZG
1.0000 | LOZENGE | OROMUCOSAL | Status: DC | PRN
  Administered 2012-11-23: 3 mg via ORAL
  Filled 2012-11-23: qty 9

## 2012-11-23 MED ORDER — NALOXONE HCL 0.4 MG/ML IJ SOLN: 0.4000 mg | INTRAMUSCULAR | Status: DC | PRN

## 2012-11-23 MED ORDER — LISINOPRIL 10 MG PO TABS
10.0000 mg | ORAL_TABLET | Freq: Every day | ORAL | Status: DC
  Filled 2012-11-23 (×4): qty 1

## 2012-11-23 MED ORDER — BUPIVACAINE LIPOSOME 1.3 % IJ SUSP
20.0000 mL | Freq: Once | INTRAMUSCULAR | Status: AC
  Administered 2012-11-23: 20 mL
  Filled 2012-11-23: qty 20

## 2012-11-23 MED ORDER — ALBUTEROL SULFATE HFA 108 (90 BASE) MCG/ACT IN AERS
INHALATION_SPRAY | RESPIRATORY_TRACT | Status: DC | PRN
  Administered 2012-11-23: 2 via RESPIRATORY_TRACT

## 2012-11-23 MED ORDER — STERILE WATER FOR IRRIGATION IR SOLN
Status: DC | PRN
  Administered 2012-11-23: 1000 mL via INTRAVESICAL

## 2012-11-23 MED ORDER — DIPHENHYDRAMINE HCL 50 MG/ML IJ SOLN: 12.5000 mg | Freq: Four times a day (QID) | INTRAMUSCULAR | Status: DC | PRN

## 2012-11-23 MED ORDER — 0.9 % SODIUM CHLORIDE (POUR BTL) OPTIME
TOPICAL | Status: DC | PRN
  Administered 2012-11-23: 1000 mL

## 2012-11-23 MED ORDER — OXYCODONE-ACETAMINOPHEN 5-325 MG PO TABS: 1.0000 | ORAL_TABLET | ORAL | Status: DC | PRN

## 2012-11-23 MED ORDER — PHENYLEPHRINE HCL 10 MG/ML IJ SOLN
INTRAMUSCULAR | Status: DC | PRN
  Administered 2012-11-23: 80 ug via INTRAVENOUS

## 2012-11-23 MED ORDER — INDIGOTINDISULFONATE SODIUM 8 MG/ML IJ SOLN
INTRAMUSCULAR | Status: DC | PRN
  Administered 2012-11-23: 40 mg via INTRAVENOUS

## 2012-11-23 MED ORDER — IBUPROFEN 800 MG PO TABS
800.0000 mg | ORAL_TABLET | Freq: Three times a day (TID) | ORAL | Status: DC | PRN
  Administered 2012-11-24 (×2): 800 mg via ORAL
  Filled 2012-11-23 (×2): qty 1

## 2012-11-23 SURGICAL SUPPLY — 79 items
ADH SKN CLS APL DERMABOND .7 (GAUZE/BANDAGES/DRESSINGS) ×2
APL SKNCLS STERI-STRIP NONHPOA (GAUZE/BANDAGES/DRESSINGS) ×2
BARRIER ADHS 3X4 INTERCEED (GAUZE/BANDAGES/DRESSINGS) ×3 IMPLANT
BENZOIN TINCTURE PRP APPL 2/3 (GAUZE/BANDAGES/DRESSINGS) ×3 IMPLANT
BRR ADH 4X3 ABS CNTRL BYND (GAUZE/BANDAGES/DRESSINGS) ×2
CHLORAPREP W/TINT 26ML (MISCELLANEOUS) ×3 IMPLANT
CLOTH BEACON ORANGE TIMEOUT ST (SAFETY) ×3 IMPLANT
CONT PATH 16OZ SNAP LID 3702 (MISCELLANEOUS) ×3 IMPLANT
COVER MAYO STAND STRL (DRAPES) ×3 IMPLANT
COVER TABLE BACK 60X90 (DRAPES) ×6 IMPLANT
COVER TIP SHEARS 8 DVNC (MISCELLANEOUS) ×2 IMPLANT
COVER TIP SHEARS 8MM DA VINCI (MISCELLANEOUS) ×1
DECANTER SPIKE VIAL GLASS SM (MISCELLANEOUS) ×3 IMPLANT
DERMABOND ADVANCED (GAUZE/BANDAGES/DRESSINGS) ×1
DERMABOND ADVANCED .7 DNX12 (GAUZE/BANDAGES/DRESSINGS) ×2 IMPLANT
DRAPE HUG U DISPOSABLE (DRAPE) ×3 IMPLANT
DRAPE LG THREE QUARTER DISP (DRAPES) ×6 IMPLANT
DRAPE WARM FLUID 44X44 (DRAPE) ×3 IMPLANT
DRSG OPSITE POSTOP 4X10 (GAUZE/BANDAGES/DRESSINGS) ×1 IMPLANT
ELECT REM PT RETURN 9FT ADLT (ELECTROSURGICAL) ×3
ELECTRODE REM PT RTRN 9FT ADLT (ELECTROSURGICAL) ×2 IMPLANT
EVACUATOR SMOKE 8.L (FILTER) ×3 IMPLANT
GAUZE VASELINE 3X9 (GAUZE/BANDAGES/DRESSINGS) IMPLANT
GLOVE BIO SURGEON STRL SZ 6.5 (GLOVE) ×3 IMPLANT
GLOVE BIO SURGEON STRL SZ7 (GLOVE) ×6 IMPLANT
GLOVE BIOGEL PI IND STRL 6.5 (GLOVE) ×2 IMPLANT
GLOVE BIOGEL PI INDICATOR 6.5 (GLOVE) ×1
GLOVE ECLIPSE 6.5 STRL STRAW (GLOVE) ×9 IMPLANT
GOWN STRL REIN XL XLG (GOWN DISPOSABLE) ×18 IMPLANT
GYRUS RUMI II 2.5CM BLUE (DISPOSABLE) ×3
GYRUS RUMI II 3.5CM BLUE (DISPOSABLE)
GYRUS RUMI II 4.0CM BLUE (DISPOSABLE)
KIT ACCESSORY DA VINCI DISP (KITS) ×1
KIT ACCESSORY DVNC DISP (KITS) ×2 IMPLANT
LEGGING LITHOTOMY PAIR STRL (DRAPES) ×3 IMPLANT
MANIPULATOR UTERINE 4.5 ZUMI (MISCELLANEOUS) IMPLANT
NEEDLE INSUFFLATION 120MM (ENDOMECHANICALS) ×3 IMPLANT
OCCLUDER COLPOPNEUMO (BALLOONS) ×1 IMPLANT
PACK LAVH (CUSTOM PROCEDURE TRAY) ×3 IMPLANT
PAD PREP 24X48 CUFFED NSTRL (MISCELLANEOUS) ×6 IMPLANT
PROTECTOR NERVE ULNAR (MISCELLANEOUS) ×6 IMPLANT
RUMI II 3.0CM BLUE KOH-EFFICIE (DISPOSABLE) ×1 IMPLANT
RUMI II GYRUS 2.5CM BLUE (DISPOSABLE) IMPLANT
RUMI II GYRUS 3.5CM BLUE (DISPOSABLE) IMPLANT
RUMI II GYRUS 4.0CM BLUE (DISPOSABLE) IMPLANT
SET CYSTO W/LG BORE CLAMP LF (SET/KITS/TRAYS/PACK) ×1 IMPLANT
SET IRRIG TUBING LAPAROSCOPIC (IRRIGATION / IRRIGATOR) ×3 IMPLANT
SOLUTION ELECTROLUBE (MISCELLANEOUS) ×3 IMPLANT
SPONGE LAP 18X18 X RAY DECT (DISPOSABLE) ×4 IMPLANT
SPONGE SURGIFOAM ABS GEL 12-7 (HEMOSTASIS) ×1 IMPLANT
STRIP CLOSURE SKIN 1/2X4 (GAUZE/BANDAGES/DRESSINGS) ×3 IMPLANT
SUT PDS AB 0 CTX 60 (SUTURE) ×2 IMPLANT
SUT PLAIN 2 0 XLH (SUTURE) ×1 IMPLANT
SUT VIC AB 0 CT1 18XCR BRD8 (SUTURE) IMPLANT
SUT VIC AB 0 CT1 27 (SUTURE) ×9
SUT VIC AB 0 CT1 27XBRD ANBCTR (SUTURE) IMPLANT
SUT VIC AB 0 CT1 27XCR 8 STRN (SUTURE) IMPLANT
SUT VIC AB 0 CT1 8-18 (SUTURE) ×6
SUT VIC AB 2-0 CT1 (SUTURE) ×1 IMPLANT
SUT VIC AB 2-0 CT2 27 (SUTURE) ×6 IMPLANT
SUT VICRYL 0 TIES 12 18 (SUTURE) ×1 IMPLANT
SUT VICRYL 0 UR6 27IN ABS (SUTURE) ×3 IMPLANT
SUT VICRYL RAPIDE 3 0 (SUTURE) ×6 IMPLANT
SYR 50ML LL SCALE MARK (SYRINGE) ×3 IMPLANT
SYSTEM CONVERTIBLE TROCAR (TROCAR) IMPLANT
TIP RUMI ORANGE 6.7MMX12CM (TIP) IMPLANT
TIP UTERINE 5.1X6CM LAV DISP (MISCELLANEOUS) IMPLANT
TIP UTERINE 6.7X10CM GRN DISP (MISCELLANEOUS) ×1 IMPLANT
TIP UTERINE 6.7X6CM WHT DISP (MISCELLANEOUS) IMPLANT
TIP UTERINE 6.7X8CM BLUE DISP (MISCELLANEOUS) IMPLANT
TOWEL OR 17X24 6PK STRL BLUE (TOWEL DISPOSABLE) ×6 IMPLANT
TRAY FOLEY CATH 14FR (SET/KITS/TRAYS/PACK) ×3 IMPLANT
TROCAR DILATING TIP 12MM 150MM (ENDOMECHANICALS) ×3 IMPLANT
TROCAR DISP BLADELESS 8 DVNC (TROCAR) ×2 IMPLANT
TROCAR DISP BLADELESS 8MM (TROCAR) ×1
TROCAR XCEL 12X100 BLDLESS (ENDOMECHANICALS) ×1 IMPLANT
TROCAR XCEL NON-BLD 5MMX100MML (ENDOMECHANICALS) ×3 IMPLANT
TUBING FILTER THERMOFLATOR (ELECTROSURGICAL) ×3 IMPLANT
WATER STERILE IRR 1000ML POUR (IV SOLUTION) ×9 IMPLANT

## 2012-11-23 NOTE — Anesthesia Preprocedure Evaluation (Addendum)
Anesthesia Evaluation  Patient identified by MRN, date of birth, ID band Patient awake    Reviewed: Allergy & Precautions, H&P , NPO status , Patient's Chart, lab work & pertinent test results  Airway Mallampati: III TM Distance: >3 FB Neck ROM: full    Dental no notable dental hx. (+) Teeth Intact   Pulmonary asthma ,  Patient will use inhaler prior to OR.   Pulmonary exam normal       Cardiovascular hypertension, Pt. on medications     Neuro/Psych negative neurological ROS  negative psych ROS   GI/Hepatic negative GI ROS, Neg liver ROS,   Endo/Other  Morbid obesity  Renal/GU negative Renal ROS  negative genitourinary   Musculoskeletal negative musculoskeletal ROS (+)   Abdominal (+) + obese,   Peds  Hematology negative hematology ROS (+)   Anesthesia Other Findings   Reproductive/Obstetrics negative OB ROS                         Anesthesia Physical Anesthesia Plan  ASA: III  Anesthesia Plan: General   Post-op Pain Management:    Induction: Intravenous  Airway Management Planned: Oral ETT  Additional Equipment:   Intra-op Plan:   Post-operative Plan: Extubation in OR  Informed Consent: I have reviewed the patients History and Physical, chart, labs and discussed the procedure including the risks, benefits and alternatives for the proposed anesthesia with the patient or authorized representative who has indicated his/her understanding and acceptance.   Dental Advisory Given  Plan Discussed with: CRNA and Surgeon  Anesthesia Plan Comments:         Anesthesia Quick Evaluation

## 2012-11-23 NOTE — Anesthesia Postprocedure Evaluation (Signed)
  Anesthesia Post Note  Patient: Lindsey Walls  Procedure(s) Performed: Procedure(s) (LRB): ATTEMPTED ROBOTIC ASSISTED TOTAL HYSTERECTOMY (N/A) HYSTERECTOMY ABDOMINAL with repair of laproscopic torcar incision; lysis of adhesions  (N/A) CYSTOSCOPY (N/A) OMENTECTOMY (N/A)  Anesthesia type: GA  Patient location: PACU  Post pain: Pain level controlled  Post assessment: Post-op Vital signs reviewed  Last Vitals:  Filed Vitals:   11/23/12 1645  BP: 143/80  Pulse: 79  Temp:   Resp: 26    Post vital signs: Reviewed  Level of consciousness: sedated  Complications: No apparent anesthesia complications

## 2012-11-23 NOTE — Brief Op Note (Signed)
11/23/2012  3:53 PM  PATIENT:  Lindsey Walls  42 y.o. female  PRE-OPERATIVE DIAGNOSIS:  FIBROIDS  POST-OPERATIVE DIAGNOSIS:  Fibroids, umbilical hernia, simple ovarian cysts  PROCEDURE:  Procedure(s): ATTEMPTED ROBOTIC ASSISTED TOTAL HYSTERECTOMY (N/A) HYSTERECTOMY ABDOMINAL with repair of laproscopic torcar incision; lysis of adhesions  (N/A) CYSTOSCOPY (N/A) OMENTECTOMY (N/A)  SURGEON:  Surgeon(s) and Role:    * Loney Laurence, MD - Primary    * Philip Aspen, DO - Assisting  ANESTHESIA:   general  EBL:  Total I/O In: 3500 [I.V.:3500] Out: 700 [Urine:450; Blood:250]  SPECIMEN:  Source of Specimen:  uterus, cervix, bilateral tubes, omentum  DISPOSITION OF SPECIMEN:  PATHOLOGY  COUNTS:  YES  TOURNIQUET:  * No tourniquets in log *  DICTATION: .Note written in EPIC  PLAN OF CARE: Admit to inpatient   PATIENT DISPOSITION:  PACU - hemodynamically stable.   Delay start of Pharmacological VTE agent (>24hrs) due to surgical blood loss or risk of bleeding: not applicable  Complications:  Visualization during the Robotic portion of the case became compromised with an omental pad that was tracking into an old incisional hernia.  The omentum was too thick to take down with the robotic instruments.  The hysterectomy was finished via Elap.  Medications: Exparel, indigo carmine, methylene blue, gel foam.  Findings:  14 weeks size uterus with multiple fibroids and very bulky.  The bladder was densely adhesed to the cervix from previous c/s.  The left ovary had a 3 cm simple cyst and the right had 3 cm ovarian cyst.  The tubes were normal.  There was a 3 x4 cm pouched defect into the anterior abdominal wall through the fascia that the omentum had tracked up into.  This was right of the old gall bladder trocar site.  My incision was above this and more midline to the defect but as the case proceeded, more and more omentum was pulled up into the pouch, thus blocking my view.  Good  spill of indigo carmine from bilateral ureteral orifices and the bladder was intact.   Technique:  After the patient was carefully positioned and anesthesia was administered, the pt was prepped and draped in the usual sterile fashion.  The speculum was placed in the vagina and the cervix dilated.  A  10 cm Rumi with a 3 cm Koh ring were placed inside the cervix and secured with the balloon and a stitch of 2-0 vicryl.  A foley catheter was used to drain the bladder.  A 12 mm incision was made 2 cm above the umbilicus and just above the previous gall bladder incision.  The Optiview trocar and scope were used to enter safely into the peritoneum and the abdomen was insufflated.  A band of tissue that at first was believed to be a falciform ligament was seen to the right of the trocar but was not impeding view.  Two 8.5 mm trocars were inserted under direct visualization of the camera about 11 cm either side of the camera port.  A 5 mm trocar for the assistant was introduced 3 cm above the iliac crest, also under direct visualization.  Because of ventilation difficulties, the pt was only in about half of the usual trendelenburg.  The Robot was docked and the PK was placed on arm 2 and the hot shears on arm 1.  I then broke scrub and sat at the console.  The round ligaments were cauterized with the PK and incised with the hot shears.  The left tube was removed from the ovary with PK and monopolar dissection and the uterine ovarian ligament was divided with the PK followed by the hot shears.  The broad ligament was begun to be cauterized and divided with the PK.  Attention was then turned to the anterior leaf of the broad ligament and it was tented up and incised as close to the cervix as possible with the monopolar.  There were very big vessels in this area and cautery was needed to control bleeding.  The anterior bulge of the fibroid uterus distorted the vesico-cervical junction and made it difficult to push the  bladder flap down.  The bladder was back filled with cysto fluid with methylene blue and there was no defect.  However, it still did not aid in marking the bladder junction with the cervix.  Attention was then turned to the right tube and anterior leaf of the broad ligament on that side.   The attempt was made to begin removing the tube from ovary but it was at this point that visualization was lost as the omentum was pulled up into the hernia and thus blocking the camera(the exact nature of which was discovered during the elap..  Gentle attempts were made to move the omentum, push it aside and maneuver the camera, but all to no avail.  The band was too thick and only seen from one side to be sure it was omentum to attempt to cut it out of the way.  The decision was made to proceed with a TAH via Elap.  The Robot was undocked and the trocars removed carefully.  A pfananstiel incision was made with the scalpel and carried down to the fascia with the bovie cautery. The fascia was incised in the midline with the scalpel and carried in a transverse curvilinear manner bilaterally.  The fascia was reflected superiorly and inferiorly off the rectus muscles and the muscles split in the midline.  A bowel free portion of the peritoneum was entered bluntly and then extended in a superior and inferior manner with good visualization of the bowel and bladder.  The bowel was packed and the [place] instrument was then placed.  The bladder was retracted inferiorly with blunt dissection.  The utero-ovarian ligament on the right side was clamped and divided with the mayos and secured with a free tie of 0 vicryl.  The tube was then removed with bovie cautery from the ovary.  The ligament was then secured with a stitch of 0 vicryl.  The rest of the broad ligament on both sides was clamped with heaneys, incised with the scissors, and then secured with a stitch of 0 vicryl.  The cardinal ligament was then divided with alternating  sucessive bites of the straight heaney, incised with the scalpel and then secured with a stitch of 0 vicryl.  At the level of the external os, two heaneys were placed on the cuff angles and the vagina entered into with the mayos.  The cervix was removed with the jorgensons scissors and the angles of the cuff closed with a figure of eight stitch of 0 vicryl.  An additional stitch of 0 vicryl closed the cuff.    Irrigation was performed and all pedicles found to be hemostatic.  Exparel was placed in the pelvis and gelfoam over the cuff.  All instruments were then removed after the abdomen was cleared with irrigation.  At this point it was discovered the extent of the omentum in the blind pouch.  The omentum was carefully removed by taking several heaneys across as close to where the pouch started.  This was incised and the pedicles secured with free ties of 2-0 vicryl.  Once this was achieved, a small portion of the omentum dislodged from the pouch and was sent to path.  The pouch was digitally explored and did not communicate with the 12 mm trocar tract placed during this operation.  The fascia of the trocar tract was closed from the peritoneal side with a figure of eight stitch.  The peritoneum was then closed with a running stitch of 2-0 vicryl.  This incorporated the rectus muscles as a separate layer.  The fascia was then closed with a running stitch of 0 vicryl.  The subcutaneous layer was closed with interrupted  stitches of 2-0 plain gut.  The skin was closed with staples.  Cystoscopy was performed and the bladder found to be intact with good spill of indigo carmine from both ureteral orifices.  The patient tolerated the procedure well and was returned to the recovery room in stable condition.  The patient has been informed of the change in operation.  All counts were correct times three.  Joyceline Maiorino A

## 2012-11-23 NOTE — Op Note (Addendum)
11/23/2012  3:53 PM  PATIENT:  Lindsey Walls  42 y.o. female  PRE-OPERATIVE DIAGNOSIS:  FIBROIDS  POST-OPERATIVE DIAGNOSIS:  Fibroids, umbilical hernia, simple ovarian cysts  PROCEDURE:  Procedure(s): ATTEMPTED ROBOTIC ASSISTED TOTAL HYSTERECTOMY (N/A) HYSTERECTOMY ABDOMINAL with repair of laproscopic torcar incision; lysis of adhesions  (N/A) CYSTOSCOPY (N/A) OMENTECTOMY (N/A)  SURGEON:  Surgeon(s) and Role:    * Loney Laurence, MD - Primary    * Philip Aspen, DO - Assisting  ANESTHESIA:   general  EBL:  Total I/O In: 3500 [I.V.:3500] Out: 700 [Urine:450; Blood:250]  SPECIMEN:  Source of Specimen:  uterus, cervix, bilateral tubes, omentum  DISPOSITION OF SPECIMEN:  PATHOLOGY  COUNTS:  YES  TOURNIQUET:  * No tourniquets in log *  DICTATION: .Note written in EPIC  PLAN OF CARE: Admit to inpatient   PATIENT DISPOSITION:  PACU - hemodynamically stable.   Delay start of Pharmacological VTE agent (>24hrs) due to surgical blood loss or risk of bleeding: not applicable  Complications:  Visualization during the Robotic portion of the case became compromised with an omental pad that was tracking into an old incisional hernia.  The omentum was too thick to take down with the robotic instruments.  The hysterectomy was finished via Elap.  Medications: Exparel, indigo carmine, methylene blue, gel foam.  Findings:  14 weeks size uterus with multiple fibroids and very bulky.  The bladder was densely adhesed to the cervix from previous c/s.  The left ovary had a 3 cm simple cyst and the right had 3 cm ovarian cyst.  The tubes were normal.  There was a 3 x4 cm pouched defect into the anterior abdominal wall through the fascia that the omentum had tracked up into.  This was right of the old gall bladder trocar site.  My incision was above this and more midline to the defect but as the case proceeded, more and more omentum was pulled up into the pouch, thus blocking my view.  Good  spill of indigo carmine from bilateral ureteral orifices and the bladder was intact.   Technique:  After the patient was carefully positioned and anesthesia was administered, the pt was prepped and draped in the usual sterile fashion.  The speculum was placed in the vagina and the cervix dilated.  A  10 cm Rumi with a 3 cm Koh ring were placed inside the cervix and secured with the balloon and a stitch of 2-0 vicryl.  A foley catheter was used to drain the bladder.  A 12 mm incision was made 2 cm above the umbilicus and just above the previous gall bladder incision.  The Optiview trocar and scope were used to enter safely into the peritoneum and the abdomen was insufflated.  A band of tissue that at first was believed to be a falciform ligament was seen to the right of the trocar but was not impeding view.  Two 8.5 mm trocars were inserted under direct visualization of the camera about 11 cm either side of the camera port.  A 5 mm trocar for the assistant was introduced 3 cm above the iliac crest, also under direct visualization.  Because of ventilation difficulties, the pt was only in about half of the usual trendelenburg.  The Robot was docked and the PK was placed on arm 2 and the hot shears on arm 1.  I then broke scrub and sat at the console.  The round ligaments were cauterized with the PK and incised with the hot shears.  The left tube was removed from the ovary with PK and monopolar dissection and the uterine ovarian ligament was divided with the PK followed by the hot shears.  The broad ligament was begun to be cauterized and divided with the PK.  Attention was then turned to the anterior leaf of the broad ligament and it was tented up and incised as close to the cervix as possible with the monopolar.  There were very big vessels in this area and cautery was needed to control bleeding.  The anterior bulge of the fibroid uterus distorted the vesico-cervical junction and made it difficult to push the  bladder flap down.  The bladder was back filled with cysto fluid with methylene blue and there was no defect.  However, it still did not aid in marking the bladder junction with the cervix.  Attention was then turned to the right tube and anterior leaf of the broad ligament on that side.   The attempt was made to begin removing the tube from ovary but it was at this point that visualization was lost as the omentum was pulled up into the hernia and thus blocking the camera(the exact nature of which was discovered during the elap..  Gentle attempts were made to move the omentum, push it aside and maneuver the camera, but all to no avail.  The band was too thick and only seen from one side to be sure it was omentum to attempt to cut it out of the way.  The decision was made to proceed with a TAH via Elap.  The Robot was undocked and the trocars removed carefully.  A pfananstiel incision was made with the scalpel and carried down to the fascia with the bovie cautery. The fascia was incised in the midline with the scalpel and carried in a transverse curvilinear manner bilaterally.  The fascia was reflected superiorly and inferiorly off the rectus muscles and the muscles split in the midline.  A bowel free portion of the peritoneum was entered bluntly and then extended in a superior and inferior manner with good visualization of the bowel and bladder.  The bowel was packed and the [place] instrument was then placed.  The bladder was retracted inferiorly with blunt dissection.  The utero-ovarian ligament on the right side was clamped and divided with the mayos and secured with a free tie of 0 vicryl.  The tube was then removed with bovie cautery from the ovary.  The ligament was then secured with a stitch of 0 vicryl.  The rest of the broad ligament on both sides was clamped with heaneys, incised with the scissors, and then secured with a stitch of 0 vicryl.  The uterine arteries were then clamped bilaterally with  straight heaneys and then incised with the scalpel and secured with stitches of 0 vicryl.  The uterus was then amputated from the cervix and the Rumi removed.  The cervical stump was secured with kochers.  The cardinal ligament was then divided with alternating sucessive bites of the straight heaney, incised with the scalpel and then secured with a stitch of 0 vicryl.  At the level of the external os, two heaneys were placed on the cuff angles and the vagina entered into with the mayos.  The cervix was removed with the jorgensons scissors and the angles of the cuff closed with a figure of eight stitch of 0 vicryl.  An additional stitch of 0 vicryl closed the cuff.    Irrigation was performed and all pedicles  found to be hemostatic.  Exparel was placed in the pelvis and gelfoam over the cuff.  All instruments were then removed after the abdomen was cleared with irrigation.  At this point it was discovered the extent of the omentum in the blind pouch.  The omentum was carefully removed by taking several heaneys across as close to where the pouch started.  This was incised and the pedicles secured with free ties of 2-0 vicryl.  Once this was achieved, a small portion of the omentum dislodged from the pouch and was sent to path.  The pouch was digitally explored and did not communicate with the 12 mm trocar tract placed during this operation.  The fascia of the trocar tract was closed from the peritoneal side with a figure of eight stitch.  The peritoneum was then closed with a running stitch of 2-0 vicryl.  This incorporated the rectus muscles as a separate layer.  The fascia was then closed with a running stitch of 0 vicryl.  The subcutaneous layer was closed with interrupted  stitches of 2-0 plain gut.  The skin was closed with staples.  Cystoscopy was performed and the bladder found to be intact with good spill of indigo carmine from both ureteral orifices.  The patient tolerated the procedure well and was  returned to the recovery room in stable condition.  The patient has been informed of the change in operation.  All counts were correct times three.  Kieron Kantner A

## 2012-11-23 NOTE — Progress Notes (Signed)
There has been no change in the patients history, status or exam since the history and physical.  Filed Vitals:   11/23/12 1026  BP: 145/81  Pulse: 67  Temp: 98.2 F (36.8 C)  TempSrc: Oral  Resp: 16  SpO2: 100%    Lab Results  Component Value Date   WBC 11.9* 11/17/2012   HGB 12.2 11/17/2012   HCT 37.3 11/17/2012   MCV 81.1 11/17/2012   PLT 312 11/17/2012   The patient again voices understanding of all R/BAlt and that she will be sterile after hysterectomy.  Lindsey Walls A

## 2012-11-23 NOTE — Transfer of Care (Signed)
Immediate Anesthesia Transfer of Care Note  Patient: Lindsey Walls  Procedure(s) Performed: Procedure(s): ATTEMPTED ROBOTIC ASSISTED TOTAL HYSTERECTOMY (N/A) HYSTERECTOMY ABDOMINAL with repair of laproscopic torcar incision; lysis of adhesions  (N/A) CYSTOSCOPY (N/A) OMENTECTOMY (N/A)  Patient Location: PACU  Anesthesia Type:General  Level of Consciousness: awake  Airway & Oxygen Therapy: Patient Spontanous Breathing  Post-op Assessment: Report given to PACU RN  Post vital signs: stable  Filed Vitals:   11/23/12 1026  BP: 145/81  Pulse: 67  Temp: 36.8 C  Resp: 16    Complications: No apparent anesthesia complications

## 2012-11-24 ENCOUNTER — Encounter (HOSPITAL_COMMUNITY): Payer: Self-pay | Admitting: Obstetrics and Gynecology

## 2012-11-24 LAB — COMPREHENSIVE METABOLIC PANEL
ALT: 27 U/L (ref 0–35)
AST: 22 U/L (ref 0–37)
Albumin: 2.8 g/dL — ABNORMAL LOW (ref 3.5–5.2)
Alkaline Phosphatase: 46 U/L (ref 39–117)
CO2: 28 mEq/L (ref 19–32)
Chloride: 100 mEq/L (ref 96–112)
Creatinine, Ser: 0.66 mg/dL (ref 0.50–1.10)
Potassium: 3.9 mEq/L (ref 3.5–5.1)
Sodium: 137 mEq/L (ref 135–145)
Total Bilirubin: 0.5 mg/dL (ref 0.3–1.2)

## 2012-11-24 LAB — CBC
Platelets: 278 10*3/uL (ref 150–400)
RBC: 3.92 MIL/uL (ref 3.87–5.11)
RDW: 14.4 % (ref 11.5–15.5)
WBC: 16.9 10*3/uL — ABNORMAL HIGH (ref 4.0–10.5)

## 2012-11-24 MED ORDER — OXYCODONE-ACETAMINOPHEN 5-325 MG PO TABS: 1.0000 | ORAL_TABLET | ORAL | Status: DC | PRN

## 2012-11-24 MED ORDER — HYDROCODONE-ACETAMINOPHEN 5-325 MG PO TABS
1.0000 | ORAL_TABLET | ORAL | Status: DC | PRN
  Administered 2012-11-24 – 2012-11-25 (×4): 2 via ORAL
  Filled 2012-11-24 (×4): qty 2

## 2012-11-24 NOTE — Progress Notes (Signed)
Patient is eating, ambulating, not voiding yet- foley just removed.  Pain control is good.  BP 106/62  Pulse 71  Temp(Src) 98.1 F (36.7 C) (Oral)  Resp 18  SpO2 100%  lungs:   clear to auscultation cor:    RRR Abdomen:  soft, appropriate tenderness, incisions intact and without erythema or exudate. ex:    no cords   Results for orders placed during the hospital encounter of 11/23/12 (from the past 24 hour(s))  PREGNANCY, URINE     Status: None   Collection Time    11/23/12 10:47 AM      Result Value Range   Preg Test, Ur NEGATIVE  NEGATIVE  CBC     Status: Abnormal   Collection Time    11/24/12  5:20 AM      Result Value Range   WBC 16.9 (*) 4.0 - 10.5 K/uL   RBC 3.92  3.87 - 5.11 MIL/uL   Hemoglobin 10.3 (*) 12.0 - 15.0 g/dL   HCT 19.1 (*) 47.8 - 29.5 %   MCV 81.6  78.0 - 100.0 fL   MCH 26.3  26.0 - 34.0 pg   MCHC 32.2  30.0 - 36.0 g/dL   RDW 62.1  30.8 - 65.7 %   Platelets 278  150 - 400 K/uL  COMPREHENSIVE METABOLIC PANEL     Status: Abnormal   Collection Time    11/24/12  5:20 AM      Result Value Range   Sodium 137  135 - 145 mEq/L   Potassium 3.9  3.5 - 5.1 mEq/L   Chloride 100  96 - 112 mEq/L   CO2 28  19 - 32 mEq/L   Glucose, Bld 110 (*) 70 - 99 mg/dL   BUN 6  6 - 23 mg/dL   Creatinine, Ser 8.46  0.50 - 1.10 mg/dL   Calcium 8.3 (*) 8.4 - 10.5 mg/dL   Total Protein 5.8 (*) 6.0 - 8.3 g/dL   Albumin 2.8 (*) 3.5 - 5.2 g/dL   AST 22  0 - 37 U/L   ALT 27  0 - 35 U/L   Alkaline Phosphatase 46  39 - 117 U/L   Total Bilirubin 0.5  0.3 - 1.2 mg/dL   GFR calc non Af Amer >90  >90 mL/min   GFR calc Af Amer >90  >90 mL/min   POD 1 from TAH.    A/P  Routine care.  Expect d/c per plan.

## 2012-11-24 NOTE — Progress Notes (Signed)
Ur chart review completed.  

## 2012-11-24 NOTE — Anesthesia Postprocedure Evaluation (Signed)
Anesthesia Post Note  Patient: Lindsey Walls  Procedure(s) Performed: Procedure(s) (LRB): ATTEMPTED ROBOTIC ASSISTED TOTAL HYSTERECTOMY (N/A) HYSTERECTOMY ABDOMINAL with repair of laproscopic torcar incision; lysis of adhesions  (N/A) CYSTOSCOPY (N/A) OMENTECTOMY (N/A)  Anesthesia type: General  Patient location: [facility]  Post pain: Pain level controlled  Post assessment: Post-op Vital signs reviewed  Last Vitals:  Filed Vitals:   11/24/12 1432  BP: 118/71  Pulse: 77  Temp: 37.4 C  Resp: 16    Post vital signs: Reviewed  Level of consciousness: sedated  Complications: No apparent anesthesia complications

## 2012-11-24 NOTE — Discharge Summary (Addendum)
Physician Discharge Summary  Patient ID: Lindsey Walls MRN: [id number] DOB/AGE: 1970/04/30 42 y.o.  Admit date: 11/23/2012 Discharge date: 11/24/2012  Admission Diagnoses:sx fibroid uterus, menorrhagia  Discharge Diagnoses: same Active Problems:   * No active hospital problems. *   Discharged Condition: good  Hospital Course: Pt admitted for Robotic TLH which was converted to TAH for lack of visualization.  Otherwise operation was uncomplicated.  D/Ced to home on post op without complication.  Consults: None  Significant Diagnostic Studies: labs:   Results for orders placed during the hospital encounter of 11/23/12 (from the past 24 hour(s))  PREGNANCY, URINE     Status: None   Collection Time    11/23/12 10:47 AM      Result Value Range   Preg Test, Ur NEGATIVE  NEGATIVE  CBC     Status: Abnormal   Collection Time    11/24/12  5:20 AM      Result Value Range   WBC 16.9 (*) 4.0 - 10.5 K/uL   RBC 3.92  3.87 - 5.11 MIL/uL   Hemoglobin 10.3 (*) 12.0 - 15.0 g/dL   HCT 62.9 (*) 52.8 - 41.3 %   MCV 81.6  78.0 - 100.0 fL   MCH 26.3  26.0 - 34.0 pg   MCHC 32.2  30.0 - 36.0 g/dL   RDW 24.4  01.0 - 27.2 %   Platelets 278  150 - 400 K/uL  COMPREHENSIVE METABOLIC PANEL     Status: Abnormal   Collection Time    11/24/12  5:20 AM      Result Value Range   Sodium 137  135 - 145 mEq/L   Potassium 3.9  3.5 - 5.1 mEq/L   Chloride 100  96 - 112 mEq/L   CO2 28  19 - 32 mEq/L   Glucose, Bld 110 (*) 70 - 99 mg/dL   BUN 6  6 - 23 mg/dL   Creatinine, Ser 5.36  0.50 - 1.10 mg/dL   Calcium 8.3 (*) 8.4 - 10.5 mg/dL   Total Protein 5.8 (*) 6.0 - 8.3 g/dL   Albumin 2.8 (*) 3.5 - 5.2 g/dL   AST 22  0 - 37 U/L   ALT 27  0 - 35 U/L   Alkaline Phosphatase 46  39 - 117 U/L   Total Bilirubin 0.5  0.3 - 1.2 mg/dL   GFR calc non Af Amer >90  >90 mL/min   GFR calc Af Amer >90  >90 mL/min    Treatments: surgery: TLH converted to TAH/bilateral salpingectomies.    Discharge Exam: Blood  pressure 106/62, pulse 71, temperature 98.1 F (36.7 C), temperature source Oral, resp. rate 18, SpO2 100.00%.  Disposition:    Future Appointments Provider Department Dept Phone   02/09/2013 8:30 AM Karie Schwalbe, MD [facility] [phone]     Medications: Hydrocodone for pain. Continue all other meds.         Follow-up Information   Follow up with [name], MD In 2 weeks.   Specialty:  Obstetrics and Gynecology   Contact information:   [name] [place] [address] [phone]       Signed: Kadeidra Coryell A 11/24/2012, 7:42 AM

## 2012-11-25 MED ORDER — MIDAZOLAM HCL 5 MG/5ML IJ SOLN
INTRAMUSCULAR | Status: DC | PRN
  Administered 2012-11-23: 2 mg via INTRAVENOUS

## 2012-11-25 MED ORDER — HYDROCODONE-ACETAMINOPHEN 5-325 MG PO TABS: 1.0000 | ORAL_TABLET | ORAL | Status: DC | PRN

## 2012-11-25 NOTE — Progress Notes (Signed)
Patient is eating, ambulating, and voiding.  Pain control is good.  BP 115/65  Pulse 87  Temp(Src) 98 F (36.7 C) (Oral)  Resp 18  Ht 5' 5.5" (1.664 m)  Wt 136.986 kg (302 lb)  BMI 49.47 kg/m2  SpO2 100%  lungs:   clear to auscultation cor:    RRR Abdomen:  soft, appropriate tenderness, incisions intact and without erythema or exudate. ex:    no cords   Lab Results  Component Value Date   WBC 16.9* 11/24/2012   HGB 10.3* 11/24/2012   HCT 32.0* 11/24/2012   MCV 81.6 11/24/2012   PLT 278 11/24/2012    A/P  Routine care.  Expect d/c per plan.

## 2012-11-25 NOTE — Addendum Note (Signed)
Addendum created 11/25/12 1526 by Algis Greenhouse, CRNA   Modules edited: Anesthesia Medication Administration

## 2012-12-13 ENCOUNTER — Inpatient Hospital Stay (HOSPITAL_COMMUNITY): Payer: Managed Care, Other (non HMO)

## 2012-12-13 ENCOUNTER — Inpatient Hospital Stay (HOSPITAL_COMMUNITY)
Admission: AD | Admit: 2012-12-13 | Discharge: 2012-12-15 | DRG: 603 | Disposition: A | Discharge status: Home / Self Care | Payer: Managed Care, Other (non HMO) | Source: Ambulatory Visit | Attending: Obstetrics & Gynecology | Admitting: Obstetrics & Gynecology

## 2012-12-13 ENCOUNTER — Encounter (HOSPITAL_COMMUNITY): Payer: Self-pay | Admitting: *Deleted

## 2012-12-13 DIAGNOSIS — K429 Umbilical hernia without obstruction or gangrene: Secondary | ICD-10-CM | POA: Diagnosis present

## 2012-12-13 DIAGNOSIS — L0291 Cutaneous abscess, unspecified: Secondary | ICD-10-CM

## 2012-12-13 DIAGNOSIS — K7689 Other specified diseases of liver: Secondary | ICD-10-CM | POA: Diagnosis present

## 2012-12-13 DIAGNOSIS — Z9089 Acquired absence of other organs: Secondary | ICD-10-CM

## 2012-12-13 DIAGNOSIS — L02219 Cutaneous abscess of trunk, unspecified: Principal | ICD-10-CM | POA: Diagnosis present

## 2012-12-13 DIAGNOSIS — Z90710 Acquired absence of both cervix and uterus: Secondary | ICD-10-CM

## 2012-12-13 HISTORY — DX: Benign neoplasm of connective and other soft tissue, unspecified: D21.9

## 2012-12-13 LAB — CBC WITH DIFFERENTIAL/PLATELET
Basophils Absolute: 0 10*3/uL (ref 0.0–0.1)
Basophils Relative: 0 % (ref 0–1)
Eosinophils Absolute: 0 10*3/uL (ref 0.0–0.7)
HCT: 31.6 % — ABNORMAL LOW (ref 36.0–46.0)
MCHC: 32.9 g/dL (ref 30.0–36.0)
Monocytes Absolute: 1.7 10*3/uL — ABNORMAL HIGH (ref 0.1–1.0)
Neutro Abs: 13.4 10*3/uL — ABNORMAL HIGH (ref 1.7–7.7)
RDW: 14.3 % (ref 11.5–15.5)

## 2012-12-13 LAB — COMPREHENSIVE METABOLIC PANEL
AST: 90 U/L — ABNORMAL HIGH (ref 0–37)
Albumin: 2.9 g/dL — ABNORMAL LOW (ref 3.5–5.2)
Calcium: 9.2 mg/dL (ref 8.4–10.5)
Chloride: 101 mEq/L (ref 96–112)
Creatinine, Ser: 0.86 mg/dL (ref 0.50–1.10)
Total Protein: 7.6 g/dL (ref 6.0–8.3)

## 2012-12-13 LAB — URINALYSIS, ROUTINE W REFLEX MICROSCOPIC
Glucose, UA: NEGATIVE mg/dL
Ketones, ur: NEGATIVE mg/dL
Nitrite: NEGATIVE
Protein, ur: 100 mg/dL — AB

## 2012-12-13 LAB — URINE MICROSCOPIC-ADD ON

## 2012-12-13 MED ORDER — LISINOPRIL-HYDROCHLOROTHIAZIDE 10-12.5 MG PO TABS: 1.0000 | ORAL_TABLET | Freq: Every day | ORAL | Status: DC

## 2012-12-13 MED ORDER — KETOROLAC TROMETHAMINE 30 MG/ML IJ SOLN
30.0000 mg | Freq: Four times a day (QID) | INTRAMUSCULAR | Status: DC | PRN
  Administered 2012-12-13: 30 mg via INTRAVENOUS
  Filled 2012-12-13: qty 1

## 2012-12-13 MED ORDER — KETOROLAC TROMETHAMINE 30 MG/ML IJ SOLN
30.0000 mg | Freq: Four times a day (QID) | INTRAMUSCULAR | Status: AC
  Administered 2012-12-13: 30 mg via INTRAVENOUS
  Filled 2012-12-13: qty 1

## 2012-12-13 MED ORDER — SIMETHICONE 80 MG PO CHEW: 80.0000 mg | CHEWABLE_TABLET | Freq: Four times a day (QID) | ORAL | Status: DC | PRN

## 2012-12-13 MED ORDER — ONDANSETRON HCL 4 MG PO TABS: 4.0000 mg | ORAL_TABLET | Freq: Four times a day (QID) | ORAL | Status: DC | PRN

## 2012-12-13 MED ORDER — ONDANSETRON HCL 4 MG/2ML IJ SOLN: 4.0000 mg | Freq: Four times a day (QID) | INTRAMUSCULAR | Status: DC | PRN

## 2012-12-13 MED ORDER — ALBUTEROL SULFATE HFA 108 (90 BASE) MCG/ACT IN AERS
2.0000 | INHALATION_SPRAY | Freq: Four times a day (QID) | RESPIRATORY_TRACT | Status: DC | PRN
  Administered 2012-12-15: 2 via RESPIRATORY_TRACT
  Filled 2012-12-13: qty 6.7

## 2012-12-13 MED ORDER — HYDROCHLOROTHIAZIDE 12.5 MG PO CAPS
12.5000 mg | ORAL_CAPSULE | Freq: Every day | ORAL | Status: DC
  Filled 2012-12-13 (×3): qty 1

## 2012-12-13 MED ORDER — LISINOPRIL 10 MG PO TABS
10.0000 mg | ORAL_TABLET | Freq: Every day | ORAL | Status: DC
  Administered 2012-12-14: 10 mg via ORAL
  Filled 2012-12-13 (×3): qty 1

## 2012-12-13 MED ORDER — PIPERACILLIN-TAZOBACTAM 3.375 G IVPB
3.3750 g | Freq: Three times a day (TID) | INTRAVENOUS | Status: DC
Start: 2012-12-13 — End: 2012-12-15
  Administered 2012-12-13 – 2012-12-15 (×5): 3.375 g via INTRAVENOUS
  Filled 2012-12-13 (×7): qty 50

## 2012-12-13 MED ORDER — MORPHINE SULFATE 4 MG/ML IJ SOLN: 2.0000 mg | INTRAMUSCULAR | Status: DC | PRN

## 2012-12-13 MED ORDER — HYDROCODONE-ACETAMINOPHEN 5-325 MG PO TABS
1.0000 | ORAL_TABLET | ORAL | Status: DC | PRN
  Administered 2012-12-13 – 2012-12-14 (×3): 2 via ORAL
  Administered 2012-12-15: 1 via ORAL
  Filled 2012-12-13 (×2): qty 2
  Filled 2012-12-13: qty 1
  Filled 2012-12-13 (×2): qty 2

## 2012-12-13 MED ORDER — ACETAMINOPHEN 325 MG PO TABS: 650.0000 mg | ORAL_TABLET | ORAL | Status: DC | PRN

## 2012-12-13 MED ORDER — IOHEXOL 300 MG/ML  SOLN
100.0000 mL | Freq: Once | INTRAMUSCULAR | Status: AC | PRN
  Administered 2012-12-13: 100 mL via INTRAVENOUS

## 2012-12-13 MED ORDER — KETOROLAC TROMETHAMINE 30 MG/ML IJ SOLN: 30.0000 mg | Freq: Four times a day (QID) | INTRAMUSCULAR | Status: DC | PRN

## 2012-12-13 MED ORDER — ZOLPIDEM TARTRATE 5 MG PO TABS: 5.0000 mg | ORAL_TABLET | Freq: Every evening | ORAL | Status: DC | PRN

## 2012-12-13 MED ORDER — LACTATED RINGERS IV SOLN
INTRAVENOUS | Status: DC
  Administered 2012-12-13 – 2012-12-15 (×4): via INTRAVENOUS

## 2012-12-13 MED ORDER — ACETAMINOPHEN 500 MG PO TABS
1000.0000 mg | ORAL_TABLET | Freq: Once | ORAL | Status: AC
  Administered 2012-12-13: 1000 mg via ORAL
  Filled 2012-12-13: qty 2

## 2012-12-13 MED ORDER — LACTATED RINGERS IV BOLUS (SEPSIS)
500.0000 mL | Freq: Once | INTRAVENOUS | Status: AC
  Administered 2012-12-13: 500 mL via INTRAVENOUS

## 2012-12-13 MED ORDER — IOHEXOL 300 MG/ML  SOLN
50.0000 mL | INTRAMUSCULAR | Status: DC
  Administered 2012-12-13: 50 mL via ORAL

## 2012-12-13 MED ORDER — KETOROLAC TROMETHAMINE 30 MG/ML IJ SOLN: 30.0000 mg | Freq: Four times a day (QID) | INTRAMUSCULAR | Status: AC

## 2012-12-13 MED ORDER — ALUM & MAG HYDROXIDE-SIMETH 200-200-20 MG/5ML PO SUSP: 30.0000 mL | ORAL | Status: DC | PRN

## 2012-12-13 NOTE — H&P (Addendum)
Please see detailed H+P from NP.  Briefly this is a 42 y.o. who is 3 weeks post-op aborted Robotic procedure/TAH with pfannanstiel incision who had been healing well until three days ago.  She began with fever and malaise today and presented with a temp of almost 103 and a WBC of 17.  A CT shows a 8x17x6 cm collection of gas and fluid in the anterior abdominal wall subcutaneous tissues.  On the CT is also seen the pt's know umbilical hernia containing adipose and fluid but not directly involved with the abscess.  There are no other abnormalities on the CT and the pelvis is clear.   Past Medical History  Diagnosis Date  . Hypertension   . Hyperlipidemia   . Obesity   . Chronic back pain   . Obesity   . Bronchitis, chronic   . Anemia   . Fibroid    Past Surgical History  Procedure Laterality Date  . Cesarean section    . Tibia fracture surgery    . Cholecystectomy    . Endometrial ablation  08/08  . Robotic assisted total hysterectomy N/A 11/23/2012    Procedure: ATTEMPTED ROBOTIC ASSISTED TOTAL HYSTERECTOMY;  Surgeon: Loney Laurence, MD;  Location: [facility];  Service: Gynecology;  Laterality: N/A;  . Abdominal hysterectomy N/A 11/23/2012    Procedure: HYSTERECTOMY ABDOMINAL with repair of laproscopic torcar incision; lysis of adhesions ;  Surgeon: Loney Laurence, MD;  Location: [facility];  Service: Gynecology;  Laterality: N/A;  . Cystoscopy N/A 11/23/2012    Procedure: CYSTOSCOPY;  Surgeon: Loney Laurence, MD;  Location: [facility];  Service: Gynecology;  Laterality: N/A;  . Omentectomy N/A 11/23/2012    Procedure: OMENTECTOMY;  Surgeon: Loney Laurence, MD;  Location: [facility];  Service: Gynecology;  Laterality: N/A;   Filed Vitals:   12/13/12 1701 12/13/12 1704 12/13/12 1847 12/13/12 1915  BP:  119/73  112/64  Pulse:  123  105  Temp: 102.5 F (39.2 C)  99.7 F (37.6 C) 99.7 F (37.6 C)  TempSrc: Oral  Axillary Oral  Resp:    20  Height:      SpO2:  97%     Lungs CTA  B Cor RRR Abs soft, incision intact except for superficial external layer; laparscopic incisions intact; pannus warm and slightly indurated- no fluctuation felt. Ex no c/c/e   Results for orders placed during the hospital encounter of 12/13/12 (from the past 24 hour(s))  URINALYSIS, ROUTINE W REFLEX MICROSCOPIC     Status: Abnormal   Collection Time    12/13/12  3:00 PM      Result Value Range   Color, Urine AMBER (*) YELLOW   APPearance CLOUDY (*) CLEAR   Specific Gravity, Urine 1.025  1.005 - 1.030   pH 6.0  5.0 - 8.0   Glucose, UA NEGATIVE  NEGATIVE mg/dL   Hgb urine dipstick MODERATE (*) NEGATIVE   Bilirubin Urine MODERATE (*) NEGATIVE   Ketones, ur NEGATIVE  NEGATIVE mg/dL   Protein, ur 161 (*) NEGATIVE mg/dL   Urobilinogen, UA 2.0 (*) 0.0 - 1.0 mg/dL   Nitrite NEGATIVE  NEGATIVE   Leukocytes, UA TRACE (*) NEGATIVE  URINE MICROSCOPIC-ADD ON     Status: Abnormal   Collection Time    12/13/12  3:00 PM      Result Value Range   Squamous Epithelial / LPF MANY (*) RARE   WBC, UA 3-6  <3 WBC/hpf   RBC / HPF 0-2  <3  RBC/hpf   Bacteria, UA MANY (*) RARE   Urine-Other MUCOUS PRESENT    CBC WITH DIFFERENTIAL     Status: Abnormal   Collection Time    12/13/12  4:00 PM      Result Value Range   WBC 17.4 (*) 4.0 - 10.5 K/uL   RBC 3.98  3.87 - 5.11 MIL/uL   Hemoglobin 10.4 (*) 12.0 - 15.0 g/dL   HCT 16.1 (*) 09.6 - 04.5 %   MCV 79.4  78.0 - 100.0 fL   MCH 26.1  26.0 - 34.0 pg   MCHC 32.9  30.0 - 36.0 g/dL   RDW 40.9  81.1 - 91.4 %   Platelets 538 (*) 150 - 400 K/uL   Neutrophils Relative % 77  43 - 77 %   Neutro Abs 13.4 (*) 1.7 - 7.7 K/uL   Lymphocytes Relative 13  12 - 46 %   Lymphs Abs 2.3  0.7 - 4.0 K/uL   Monocytes Relative 10  3 - 12 %   Monocytes Absolute 1.7 (*) 0.1 - 1.0 K/uL   Eosinophils Relative 0  0 - 5 %   Eosinophils Absolute 0.0  0.0 - 0.7 K/uL   Basophils Relative 0  0 - 1 %   Basophils Absolute 0.0  0.0 - 0.1 K/uL  COMPREHENSIVE METABOLIC PANEL      Status: Abnormal   Collection Time    12/13/12  4:00 PM      Result Value Range   Sodium 137  135 - 145 mEq/L   Potassium 3.5  3.5 - 5.1 mEq/L   Chloride 101  96 - 112 mEq/L   CO2 25  19 - 32 mEq/L   Glucose, Bld 130 (*) 70 - 99 mg/dL   BUN 9  6 - 23 mg/dL   Creatinine, Ser 7.82  0.50 - 1.10 mg/dL   Calcium 9.2  8.4 - 95.6 mg/dL   Total Protein 7.6  6.0 - 8.3 g/dL   Albumin 2.9 (*) 3.5 - 5.2 g/dL   AST 90 (*) 0 - 37 U/L   ALT 119 (*) 0 - 35 U/L   Alkaline Phosphatase 188 (*) 39 - 117 U/L   Total Bilirubin 1.7 (*) 0.3 - 1.2 mg/dL   GFR calc non Af Amer 82 (*) >90 mL/min   GFR calc Af Amer >90  >90 mL/min   A/P Large subcutaneous abscess 3 weeks post op TAH.  D/w IR- will be able to a CT-guided drainage of 17 cm abscess.  IR, Rad and I agree that the fluid collection in one of the hernias is incidental and that the hernias are not involved in the large abscess.  Will admit, NPO p MN, draw coag panel and beging Zosyn 3.375 mg over 4 hours.  IR to transfer pt to [facility] or [facility] for drainage and then will plan on D/C depending on response to antibiotics and drainage.

## 2012-12-13 NOTE — MAU Note (Addendum)
Pt reports she had abd hysterectomy on 11/23/12. Had been feeling Ok up to Thursday. Now c/o feeling weak/ no energy and dizzy, nauseated increased right abd pain that was not there before. Saw Dr. Henderson Cloud on Wednesday and felt ok then. (she did something to incision to make it heal better (possible cauterizing with silver nitrate). Feels a little SOB with exertion.

## 2012-12-13 NOTE — MAU Provider Note (Signed)
CC: Post-op Problem    First Provider Initiated Contact with Patient 12/13/12 1537      HPI Lindsey Walls is a 41 y.o. [id number] 3 wks postop attempted robotic TLH>TAH, (with omentectomy, lysis of adhesions, cystoscopy) who presents with onset 3 days ago of malaise, fatigue, weakness and dizziness. She saw Dr. Henderson Cloud for postop visit 4 days ago and had silver nitrate applied to suprapubic incision. Abdominal wounds are not painful, bleeding or draining. She does saline cleansing bid. Yesterday and today she's had throbbing, constant right-sided abdominal pain which does not radiate. Feels like a hard lump in mid lower abd. She's been nauseated but no vomiting. Appetite is decreased but has been same throughput postop period.  Felt hot yesterday and today but did not take temperature. Last bowel movement 2 days ago was normal. Denies dysuria, hematuria, urgency or frequency or urination; no back pain other than soreness from lying down a lot for 2 days. Denies cough, pleuritic chest pain, SOB, it has had some dyspnea on exertion yesterday and today. She stopped taking Vicodin four days ago.   Past Medical History  Diagnosis Date  . Hypertension   . Hyperlipidemia   . Obesity   . Chronic back pain   . Obesity   . Bronchitis, chronic   . Anemia   . Fibroid     OB History  Gravida Para Term Preterm AB SAB TAB Ectopic Multiple Living  3 2 2  1 1    2     # Outcome Date GA Lbr Len/2nd Weight Sex Delivery Anes PTL Lv  3 SAB           2 TRM           1 TRM               Past Surgical History  Procedure Laterality Date  . Cesarean section    . Tibia fracture surgery    . Cholecystectomy    . Endometrial ablation  08/08  . Robotic assisted total hysterectomy N/A 11/23/2012    Procedure: ATTEMPTED ROBOTIC ASSISTED TOTAL HYSTERECTOMY;  Surgeon: Loney Laurence, MD;  Location: [facility];  Service: Gynecology;  Laterality: N/A;  . Abdominal hysterectomy N/A 11/23/2012    Procedure:  HYSTERECTOMY ABDOMINAL with repair of laproscopic torcar incision; lysis of adhesions ;  Surgeon: Loney Laurence, MD;  Location: [facility];  Service: Gynecology;  Laterality: N/A;  . Cystoscopy N/A 11/23/2012    Procedure: CYSTOSCOPY;  Surgeon: Loney Laurence, MD;  Location: [facility];  Service: Gynecology;  Laterality: N/A;  . Omentectomy N/A 11/23/2012    Procedure: OMENTECTOMY;  Surgeon: Loney Laurence, MD;  Location: [facility];  Service: Gynecology;  Laterality: N/A;    History   Social History  . Marital Status: Divorced    Spouse Name: N/A    Number of Children: 2  . Years of Education: N/A   Occupational History  . Counsellor    Social History Main Topics  . Smoking status: Never Smoker   . Smokeless tobacco: Never Used  . Alcohol Use: No  . Drug Use: No  . Sexual Activity: Not on file   Other Topics Concern  . Not on file   Social History Narrative  . No narrative on file    No current facility-administered medications on file prior to encounter.   Current Outpatient Prescriptions on File Prior to Encounter  Medication Sig Dispense Refill  . HYDROcodone-acetaminophen (NORCO/VICODIN) 5-325 MG per  tablet Take 1-2 tablets by mouth every 4 (four) hours as needed.  30 tablet  0  . lisinopril-hydrochlorothiazide (PRINZIDE,ZESTORETIC) 10-12.5 MG per tablet Take 1 tablet by mouth daily.  90 tablet  3  . albuterol (PROVENTIL HFA;VENTOLIN HFA) 108 (90 BASE) MCG/ACT inhaler Inhale 2 puffs into the lungs every 6 (six) hours as needed.  18 g  1    No Known Allergies  ROS Pertinent items in HPI  PHYSICAL EXAM Filed Vitals:   12/13/12 1522  BP: 135/73  Pulse: 120  Temp: 102.9 F (39.4 C)  Resp: 20  SpO2 98  General: Obese pleasant female, appears listless Cardiovascular: Rate 120, regular rhythm, no murmur heard Respiratory: Normal effort, CTA with no adventitious sounds heard, diminished BS    both bases Abdomen: Soft, BS diminished but  present;Pfannensteil incision with skin      separation 5cm centrally, no exudate or erythema>cleansed with saline and apron dressing applied;         periumbilical  incision appears abraded, not infected; mild erythema of lower pannus, no drainage.   Tender to palpation right>left mid and lower abd, voluntary guarding, no rebound Back: No CVAT Extremities: No edema Neurologic: Alert and oriented, nonfocal  LAB RESULTS Results for orders placed during the hospital encounter of 12/13/12 (from the past 24 hour(s))  URINALYSIS, ROUTINE W REFLEX MICROSCOPIC     Status: Abnormal   Collection Time    12/13/12  3:00 PM      Result Value Range   Color, Urine AMBER (*) YELLOW   APPearance CLOUDY (*) CLEAR   Specific Gravity, Urine 1.025  1.005 - 1.030   pH 6.0  5.0 - 8.0   Glucose, UA NEGATIVE  NEGATIVE mg/dL   Hgb urine dipstick MODERATE (*) NEGATIVE   Bilirubin Urine MODERATE (*) NEGATIVE   Ketones, ur NEGATIVE  NEGATIVE mg/dL   Protein, ur 308 (*) NEGATIVE mg/dL   Urobilinogen, UA 2.0 (*) 0.0 - 1.0 mg/dL   Nitrite NEGATIVE  NEGATIVE   Leukocytes, UA TRACE (*) NEGATIVE  URINE MICROSCOPIC-ADD ON     Status: Abnormal   Collection Time    12/13/12  3:00 PM      Result Value Range   Squamous Epithelial / LPF MANY (*) RARE   WBC, UA 3-6  <3 WBC/hpf   RBC / HPF 0-2  <3 RBC/hpf   Bacteria, UA MANY (*) RARE   Urine-Other MUCOUS PRESENT    CBC WITH DIFFERENTIAL     Status: Abnormal   Collection Time    12/13/12  4:00 PM      Result Value Range   WBC 17.4 (*) 4.0 - 10.5 K/uL   RBC 3.98  3.87 - 5.11 MIL/uL   Hemoglobin 10.4 (*) 12.0 - 15.0 g/dL   HCT 65.7 (*) 84.6 - 96.2 %   MCV 79.4  78.0 - 100.0 fL   MCH 26.1  26.0 - 34.0 pg   MCHC 32.9  30.0 - 36.0 g/dL   RDW 95.2  84.1 - 32.4 %   Platelets 538 (*) 150 - 400 K/uL   Neutrophils Relative % 77  43 - 77 %   Neutro Abs 13.4 (*) 1.7 - 7.7 K/uL   Lymphocytes Relative 13  12 - 46 %   Lymphs Abs 2.3  0.7 - 4.0 K/uL   Monocytes Relative 10   3 - 12 %   Monocytes Absolute 1.7 (*) 0.1 - 1.0 K/uL   Eosinophils Relative 0  0 -  5 %   Eosinophils Absolute 0.0  0.0 - 0.7 K/uL   Basophils Relative 0  0 - 1 %   Basophils Absolute 0.0  0.0 - 0.1 K/uL  COMPREHENSIVE METABOLIC PANEL     Status: Abnormal   Collection Time    12/13/12  4:00 PM      Result Value Range   Sodium 137  135 - 145 mEq/L   Potassium 3.5  3.5 - 5.1 mEq/L   Chloride 101  96 - 112 mEq/L   CO2 25  19 - 32 mEq/L   Glucose, Bld 130 (*) 70 - 99 mg/dL   BUN 9  6 - 23 mg/dL   Creatinine, Ser 1.61  0.50 - 1.10 mg/dL   Calcium 9.2  8.4 - 09.6 mg/dL   Total Protein 7.6  6.0 - 8.3 g/dL   Albumin 2.9 (*) 3.5 - 5.2 g/dL   AST 90 (*) 0 - 37 U/L   ALT 119 (*) 0 - 35 U/L   Alkaline Phosphatase 188 (*) 39 - 117 U/L   Total Bilirubin 1.7 (*) 0.3 - 1.2 mg/dL   GFR calc non Af Amer 82 (*) >90 mL/min   GFR calc Af Amer >90  >90 mL/min    IMAGING No results found. CLINICAL DATA: Postoperative fever and abdominal pain. Hysterectomy  several weeks ago. Weakness.  EXAM:  CT ABDOMEN AND PELVIS WITH CONTRAST  TECHNIQUE:  Multidetector CT imaging of the abdomen and pelvis was performed  using the standard protocol following bolus administration of  intravenous contrast.  CONTRAST: OMNIPAQUE IOHEXOL 300 MG/ML SOLN  COMPARISON: None.  FINDINGS:  Diffuse hepatic steatosis. Spleen, pancreas, and adrenal glands  normal. Gallbladder surgically absent.  Kidneys unremarkable. Appendix normal.  Uterus absent. Ovaries are stool present. No free pelvic fluid.  7.9 x 17.5 by 6.0 cm collection of gas and fluid in the anterior  abdominal wall subcutaneous tissues below the umbilicus. There is a  small tract of gas extending towards the skin the through the pannus  as shown on image 114 of series 603, possibly a draining sinus  tract. The presence of gas is unusual this 4 out from surgery and is  suspicious for abscess.  There is also a 0.6 x 2.9 by 5.7 cm multilobular umbilical  hernia  containing adipose and fluid density but no loops of bowel. Hernia  neck proximally 3.1 x 4.7 cm.  IMPRESSION:  1. Infraumbilical subcutaneous collection of gas and fluid in the  pannus, suspicious for abscess. Some of the gas tracks towards the  skin surface, query draining sinus tract.  2. Multi lobular umbilical hernia containing omental adipose tissue  and fluid densities.  3. Diffuse hepatic steatosis.  Electronically Signed  By: Herbie Baltimore  On: 12/13/2012 19:55   MAU COURSE Toradol 30 mg IV given without relief IV LR 500cc bolus  ASSESSMENT/PLAN C/W Dr. Arlyce Dice. Dr. Henderson Cloud to come see pt., review findings and determine POC       Chari Parmenter Colin Mulders, CNM 12/13/2012 3:54 PM

## 2012-12-14 ENCOUNTER — Encounter (HOSPITAL_COMMUNITY): Payer: Self-pay | Admitting: Radiology

## 2012-12-14 ENCOUNTER — Ambulatory Visit (HOSPITAL_COMMUNITY): Payer: Managed Care, Other (non HMO)

## 2012-12-14 LAB — CBC
HCT: 29.1 % — ABNORMAL LOW (ref 36.0–46.0)
Hemoglobin: 9.5 g/dL — ABNORMAL LOW (ref 12.0–15.0)
MCH: 25.9 pg — ABNORMAL LOW (ref 26.0–34.0)
MCHC: 32.6 g/dL (ref 30.0–36.0)
MCV: 79.3 fL (ref 78.0–100.0)
RBC: 3.67 MIL/uL — ABNORMAL LOW (ref 3.87–5.11)

## 2012-12-14 LAB — URINE CULTURE: Colony Count: 30000

## 2012-12-14 MED ORDER — FENTANYL CITRATE 0.05 MG/ML IJ SOLN
INTRAMUSCULAR | Status: AC | PRN
  Administered 2012-12-14 (×2): 50 ug via INTRAVENOUS

## 2012-12-14 MED ORDER — MIDAZOLAM HCL 2 MG/2ML IJ SOLN
INTRAMUSCULAR | Status: AC | PRN
  Administered 2012-12-14: 2 mg via INTRAVENOUS

## 2012-12-14 NOTE — H&P (Signed)
Chief Complaint: "I am here for a drain placement." Referring Physician: Dr. Henderson Cloud HPI: Lindsey Walls is an 42 y.o. female s/p robotic TAH with pfannenstiel incision 3 weeks ago. Patient presented with abdominal pain, fever and elevated WBC. CT on 12/13/12 revealed Infraumbilical subcutaneous collection of gas and fluid in the pannus, suspicious for abscess. She denies any current pain, fever or chills. She denies any active bleeding, blood in stool or urine. She denies any chest pain or shortness of breath.   Past Medical History:  Past Medical History  Diagnosis Date  . Hypertension   . Hyperlipidemia   . Obesity   . Chronic back pain   . Obesity   . Bronchitis, chronic   . Anemia   . Fibroid     Past Surgical History:  Past Surgical History  Procedure Laterality Date  . Cesarean section    . Tibia fracture surgery    . Cholecystectomy    . Endometrial ablation  08/08  . Robotic assisted total hysterectomy N/A 11/23/2012    Procedure: ATTEMPTED ROBOTIC ASSISTED TOTAL HYSTERECTOMY;  Surgeon: Loney Laurence, MD;  Location: [facility];  Service: Gynecology;  Laterality: N/A;  . Abdominal hysterectomy N/A 11/23/2012    Procedure: HYSTERECTOMY ABDOMINAL with repair of laproscopic torcar incision; lysis of adhesions ;  Surgeon: Loney Laurence, MD;  Location: [facility];  Service: Gynecology;  Laterality: N/A;  . Cystoscopy N/A 11/23/2012    Procedure: CYSTOSCOPY;  Surgeon: Loney Laurence, MD;  Location: [facility];  Service: Gynecology;  Laterality: N/A;  . Omentectomy N/A 11/23/2012    Procedure: OMENTECTOMY;  Surgeon: Loney Laurence, MD;  Location: [facility];  Service: Gynecology;  Laterality: N/A;    Family History:  Family History  Problem Relation Age of Onset  . Coronary artery disease Mother   . Hypertension Father   . Coronary artery disease Sister     Social History:  reports that she has never smoked. She has never used smokeless tobacco. She reports that she does not  drink alcohol or use illicit drugs.  Allergies: No Known Allergies    Medication List    ASK your doctor about these medications       albuterol 108 (90 BASE) MCG/ACT inhaler  Commonly known as:  PROVENTIL HFA;VENTOLIN HFA  Inhale 2 puffs into the lungs every 6 (six) hours as needed.     HYDROcodone-acetaminophen 5-325 MG per tablet  Commonly known as:  NORCO/VICODIN  Take 1-2 tablets by mouth every 4 (four) hours as needed.     lisinopril-hydrochlorothiazide 10-12.5 MG per tablet  Commonly known as:  PRINZIDE,ZESTORETIC  Take 1 tablet by mouth daily.     naproxen sodium 220 MG tablet  Commonly known as:  ANAPROX  Take 220 mg by mouth daily as needed (pain).        Please HPI for pertinent positives, otherwise complete 10 system ROS negative.  Physical Exam: BP 102/61  Pulse 87  Temp(Src) 97.8 F (36.6 C) (Oral)  Resp 16  Ht 5' 5.5" (1.664 m)  Wt 293 lb (132.904 kg)  BMI 48 kg/m2  SpO2 100%  LMP 10/12/2012 Body mass index is 48 kg/(m^2).   General Appearance:  Alert, cooperative, no distress, appears stated age  Head:  Normocephalic, without obvious abnormality, atraumatic  Lungs:   Clear to auscultation bilaterally, no w/r/r, respirations unlabored without use of accessory muscles.  Chest Wall:  No tenderness or deformity  Heart:  Regular rate and rhythm, S1, S2  normal, no murmur, rub or gallop.  Abdomen:   Soft, tender to deep palpation, non distended. Incision intact and without erythema.  Extremities: Extremities normal, atraumatic, no cyanosis or edema  Pulses: 2+ and symmetric  Neurologic: Normal affect, no gross deficits.   Results for orders placed during the hospital encounter of 12/13/12 (from the past 48 hour(s))  URINALYSIS, ROUTINE W REFLEX MICROSCOPIC     Status: Abnormal   Collection Time    12/13/12  3:00 PM      Result Value Range   Color, Urine AMBER (*) YELLOW   Comment: BIOCHEMICALS MAY BE AFFECTED BY COLOR   APPearance CLOUDY (*) CLEAR    Specific Gravity, Urine 1.025  1.005 - 1.030   pH 6.0  5.0 - 8.0   Glucose, UA NEGATIVE  NEGATIVE mg/dL   Hgb urine dipstick MODERATE (*) NEGATIVE   Bilirubin Urine MODERATE (*) NEGATIVE   Ketones, ur NEGATIVE  NEGATIVE mg/dL   Protein, ur 409 (*) NEGATIVE mg/dL   Urobilinogen, UA 2.0 (*) 0.0 - 1.0 mg/dL   Nitrite NEGATIVE  NEGATIVE   Leukocytes, UA TRACE (*) NEGATIVE  URINE MICROSCOPIC-ADD ON     Status: Abnormal   Collection Time    12/13/12  3:00 PM      Result Value Range   Squamous Epithelial / LPF MANY (*) RARE   WBC, UA 3-6  <3 WBC/hpf   RBC / HPF 0-2  <3 RBC/hpf   Bacteria, UA MANY (*) RARE   Urine-Other MUCOUS PRESENT    CBC WITH DIFFERENTIAL     Status: Abnormal   Collection Time    12/13/12  4:00 PM      Result Value Range   WBC 17.4 (*) 4.0 - 10.5 K/uL   RBC 3.98  3.87 - 5.11 MIL/uL   Hemoglobin 10.4 (*) 12.0 - 15.0 g/dL   HCT 81.1 (*) 91.4 - 78.2 %   MCV 79.4  78.0 - 100.0 fL   MCH 26.1  26.0 - 34.0 pg   MCHC 32.9  30.0 - 36.0 g/dL   RDW 95.6  21.3 - 08.6 %   Platelets 538 (*) 150 - 400 K/uL   Neutrophils Relative % 77  43 - 77 %   Neutro Abs 13.4 (*) 1.7 - 7.7 K/uL   Lymphocytes Relative 13  12 - 46 %   Lymphs Abs 2.3  0.7 - 4.0 K/uL   Monocytes Relative 10  3 - 12 %   Monocytes Absolute 1.7 (*) 0.1 - 1.0 K/uL   Eosinophils Relative 0  0 - 5 %   Eosinophils Absolute 0.0  0.0 - 0.7 K/uL   Basophils Relative 0  0 - 1 %   Basophils Absolute 0.0  0.0 - 0.1 K/uL  COMPREHENSIVE METABOLIC PANEL     Status: Abnormal   Collection Time    12/13/12  4:00 PM      Result Value Range   Sodium 137  135 - 145 mEq/L   Potassium 3.5  3.5 - 5.1 mEq/L   Chloride 101  96 - 112 mEq/L   CO2 25  19 - 32 mEq/L   Glucose, Bld 130 (*) 70 - 99 mg/dL   BUN 9  6 - 23 mg/dL   Creatinine, Ser 5.78  0.50 - 1.10 mg/dL   Calcium 9.2  8.4 - 46.9 mg/dL   Total Protein 7.6  6.0 - 8.3 g/dL   Albumin 2.9 (*) 3.5 - 5.2 g/dL   AST 90 (*)  0 - 37 U/L   ALT 119 (*) 0 - 35 U/L   Alkaline  Phosphatase 188 (*) 39 - 117 U/L   Total Bilirubin 1.7 (*) 0.3 - 1.2 mg/dL   GFR calc non Af Amer 82 (*) >90 mL/min   GFR calc Af Amer >90  >90 mL/min   Comment: (NOTE)     The eGFR has been calculated using the CKD EPI equation.     This calculation has not been validated in all clinical situations.     eGFR's persistently <90 mL/min signify possible Chronic Kidney     Disease.  CULTURE, BLOOD (ROUTINE X 2)     Status: None   Collection Time    12/13/12  5:30 PM      Result Value Range   Specimen Description BLOOD LEFT ARM     Special Requests       Value: BOTTLES DRAWN AEROBIC AND ANAEROBIC 10CC BOTH BOTTLES   Culture  Setup Time       Value: 12/13/2012 21:48     Performed at [facility]   Culture       Value:        BLOOD CULTURE RECEIVED NO GROWTH TO DATE CULTURE WILL BE HELD FOR 5 DAYS BEFORE ISSUING A FINAL NEGATIVE REPORT     Performed at [facility]   Report Status PENDING    CULTURE, BLOOD (ROUTINE X 2)     Status: None   Collection Time    12/13/12  5:41 PM      Result Value Range   Specimen Description BLOOD RIGHT ARM     Special Requests       Value: BOTTLES DRAWN AEROBIC AND ANAEROBIC 10CC BOTH BOTTLES   Culture  Setup Time       Value: 12/13/2012 21:49     Performed at [facility]   Culture       Value:        BLOOD CULTURE RECEIVED NO GROWTH TO DATE CULTURE WILL BE HELD FOR 5 DAYS BEFORE ISSUING A FINAL NEGATIVE REPORT     Performed at [facility]   Report Status PENDING    APTT     Status: None   Collection Time    12/14/12  5:15 AM      Result Value Range   aPTT 31  24 - 37 seconds  CBC     Status: Abnormal   Collection Time    12/14/12  5:15 AM      Result Value Range   WBC 19.8 (*) 4.0 - 10.5 K/uL   RBC 3.67 (*) 3.87 - 5.11 MIL/uL   Hemoglobin 9.5 (*) 12.0 - 15.0 g/dL   HCT 57.8 (*) 46.9 - 62.9 %   MCV 79.3  78.0 - 100.0 fL   MCH 25.9 (*) 26.0 - 34.0 pg   MCHC 32.6  30.0 - 36.0 g/dL   RDW 52.8  41.3 - 24.4 %    Platelets 463 (*) 150 - 400 K/uL  FIBRINOGEN     Status: Abnormal   Collection Time    12/14/12  5:15 AM      Result Value Range   Fibrinogen >800 (*) 204 - 475 mg/dL   Comment: REPEATED TO VERIFY  PROTIME-INR     Status: Abnormal   Collection Time    12/14/12  5:15 AM      Result Value Range   Prothrombin Time 16.2 (*) 11.6 - 15.2 seconds  INR 1.33  0.00 - 1.49   Ct Abdomen Pelvis W Contrast  12/13/2012   CLINICAL DATA:  Postoperative fever and abdominal pain. Hysterectomy several weeks ago. Weakness.  EXAM: CT ABDOMEN AND PELVIS WITH CONTRAST  TECHNIQUE: Multidetector CT imaging of the abdomen and pelvis was performed using the standard protocol following bolus administration of intravenous contrast.  CONTRAST:  OMNIPAQUE IOHEXOL 300 MG/ML  SOLN  COMPARISON:  None.  FINDINGS: Diffuse hepatic steatosis. Spleen, pancreas, and adrenal glands normal. Gallbladder surgically absent.  Kidneys unremarkable.  Appendix normal.  Uterus absent. Ovaries are stool present.  No free pelvic fluid.  7.9 x 17.5 by 6.0 cm collection of gas and fluid in the anterior abdominal wall subcutaneous tissues below the umbilicus. There is a small tract of gas extending towards the skin the through the pannus as shown on image 114 of series 603, possibly a draining sinus tract. The presence of gas is unusual this 4 out from surgery and is suspicious for abscess.  There is also a 0.6 x 2.9 by 5.7 cm multilobular umbilical hernia containing adipose and fluid density but no loops of bowel. Hernia neck proximally 3.1 x 4.7 cm.  IMPRESSION: 1. Infraumbilical subcutaneous collection of gas and fluid in the pannus, suspicious for abscess. Some of the gas tracks towards the skin surface, query draining sinus tract. 2. Multi lobular umbilical hernia containing omental adipose tissue and fluid densities. 3. Diffuse hepatic steatosis.   Electronically Signed   By: Herbie Baltimore   On: 12/13/2012 19:55    Assessment/Plan S/p  Robotic procedure/TAH with pfannanstiel incision. CT 12/13/12 revealed Infraumbilical subcutaneous collection of gas and fluid in the pannus, suspicious for abscess. Leukocytosis on IV antibiotics. Request for CT guided drain placement.  Labs reviewed, patient has been NPO. Risks and Benefits discussed with the patient. All of the patient's questions were answered, patient is agreeable to proceed. Consent signed and in chart.   Pattricia Boss D PA-C 12/14/2012, 11:42 AM

## 2012-12-14 NOTE — Procedures (Signed)
Successful abdominal wall abscess drain insertion 12 fr No comp Stable Keep to bulb suction 270cc pus aspirated

## 2012-12-14 NOTE — Progress Notes (Signed)
Ur chart review completed.  

## 2012-12-14 NOTE — Progress Notes (Signed)
Patient is eating, ambulating, and voiding.  Pain control is good.  BP 98/68  Pulse 87  Temp(Src) 97.8 F (36.6 C) (Oral)  Resp 20  Ht 5' 5.5" (1.664 m)  Wt 132.904 kg (293 lb)  BMI 48 kg/m2  SpO2 100%  LMP 10/12/2012  lungs:   clear to auscultation cor:    RRR Abdomen:  soft, appropriate tenderness, incisions intact and without erythema or exudate. ex:    no cords   Lab Results  Component Value Date   WBC 19.8* 12/14/2012   HGB 9.5* 12/14/2012   HCT 29.1* 12/14/2012   MCV 79.3 12/14/2012   PLT 463* 12/14/2012    A/P  HD 1 Post op subcutatneous abscess from TAH 3 weeks ago.  On IV antibiotics.  For IR drainage of abscess today.

## 2012-12-15 MED ORDER — HYDROCODONE-ACETAMINOPHEN 5-325 MG PO TABS: 1.0000 | ORAL_TABLET | ORAL | Status: DC | PRN

## 2012-12-15 MED ORDER — SCOPOLAMINE 1 MG/3DAYS TD PT72
MEDICATED_PATCH | TRANSDERMAL | Status: AC
  Filled 2012-12-15: qty 1

## 2012-12-15 MED ORDER — KETOROLAC TROMETHAMINE 30 MG/ML IJ SOLN
INTRAMUSCULAR | Status: AC
  Filled 2012-12-15: qty 1

## 2012-12-15 MED ORDER — CEPHALEXIN 500 MG PO CAPS: 500.0000 mg | ORAL_CAPSULE | Freq: Four times a day (QID) | ORAL | Status: AC

## 2012-12-15 NOTE — Discharge Summary (Signed)
Physician Discharge Summary  Patient ID: Lindsey Walls MRN: [id number] DOB/AGE: 1970/09/06 43 y.o.  Admit date: 12/13/2012 Discharge date: 12/15/2012  Admission Diagnoses:Post op subq abdominal abscess  Discharge Diagnoses:  Active Problems:   * No active hospital problems. *   Discharged Condition: good  Hospital Course: Uncomplicated course of IV antibiotics and CT guided drainage of subq abdominal abscess post op 3 weeks.  Pt remained afeb in hospital.    Consults: Interventional Radiology  Significant Diagnostic Studies: microbiology: blood culture: no growth to date., abscess culture +gram pos cocci in pairs.  Treatments: CT guided drainage and JP placement  Discharge Exam: Blood pressure 109/73, pulse 93, temperature 97.9 F (36.6 C), temperature source Oral, resp. rate 18, height 5' 5.5" (1.664 m), weight 132.904 kg (293 lb), last menstrual period 10/12/2012, SpO2 99.00%.   Disposition: 01-Home or Self Care  Discharge Orders   Future Appointments Provider Department Dept Phone   02/09/2013 8:30 AM Karie Schwalbe, MD [facility] [phone]   Future Orders Complete By Expires   Call MD for:  temperature >100.4  As directed    Diet - low sodium heart healthy  As directed    Discharge instructions  As directed    Comments:     No driving on narcotics, no sexual activity for 2 weeks.   Increase activity slowly  As directed    May shower / Bathe  As directed    Comments:     Shower, no bath for 2 weeks.   Remove dressing in 24 hours  As directed    Sexual Activity Restrictions  As directed    Comments:     No sexual activity for 2 weeks.       Medication List         albuterol 108 (90 BASE) MCG/ACT inhaler  Commonly known as:  PROVENTIL HFA;VENTOLIN HFA  Inhale 2 puffs into the lungs every 6 (six) hours as needed.     HYDROcodone-acetaminophen 5-325 MG per tablet  Commonly known as:  NORCO/VICODIN  Take 1-2 tablets by mouth every 4  (four) hours as needed.     HYDROcodone-acetaminophen 5-325 MG per tablet  Commonly known as:  NORCO/VICODIN  Take 1-2 tablets by mouth every 4 (four) hours as needed.     lisinopril-hydrochlorothiazide 10-12.5 MG per tablet  Commonly known as:  PRINZIDE,ZESTORETIC  Take 1 tablet by mouth daily.     naproxen sodium 220 MG tablet  Commonly known as:  ANAPROX  Take 220 mg by mouth daily as needed (pain).      keflex 500 mg QID for 10 days.     Follow-up Information   Follow up with Zarea Diesing A, MD In 3 days.   Specialty:  Obstetrics and Gynecology   Contact information:   [name] [place] [address] [phone]       Signed: Jozelyn Kuwahara A 12/15/2012, 8:19 AM

## 2012-12-15 NOTE — Progress Notes (Addendum)
Patient is eating, ambulating, and voiding.  Pain control is good.  BP 109/73  Pulse 93  Temp(Src) 97.9 F (36.6 C) (Oral)  Resp 18  Ht 5' 5.5" (1.664 m)  Wt 132.904 kg (293 lb)  BMI 48 kg/m2  SpO2 99%  LMP 10/12/2012  lungs:   clear to auscultation cor:    RRR Abdomen:  soft, appropriate tenderness, incisions intact and without erythema or exudate.  JP drain in place. ex:    no cords   Lab Results  Component Value Date   WBC 19.8* 12/14/2012   HGB 9.5* 12/14/2012   HCT 29.1* 12/14/2012   MCV 79.3 12/14/2012   PLT 463* 12/14/2012    A/P  Routine care post CT guided drainage of subcutaneous abscess.  Expect d/c today as she has been almost 48 hours afeb.  D/C on keflex 500 mg QID.  Will remove JP drain as outpt in office when IR says ok to d/c.  Blood cultures no growth to date.

## 2012-12-15 NOTE — Progress Notes (Signed)
Discharge instructions reviewed with patient.  Instructed with demonstration how to flush drain with 5cc normal saline and how to empty JP drain.  Patient returned demonstration with proper technique and stated she understood the procedure.  Patient states understanding of home care, medications, activity, signs/symptoms to report to MD and return MD office visit.  Patient discharged in stable condition via wheelchair with staff without incident.  Instructed patient to keep log of drainage from drain and take with her to MD visit.

## 2012-12-17 LAB — CULTURE, ROUTINE-ABSCESS

## 2012-12-19 LAB — CULTURE, BLOOD (ROUTINE X 2)
Culture: NO GROWTH
Culture: NO GROWTH

## 2013-02-09 ENCOUNTER — Encounter: Payer: Self-pay | Admitting: Internal Medicine

## 2013-02-09 ENCOUNTER — Ambulatory Visit (INDEPENDENT_AMBULATORY_CARE_PROVIDER_SITE_OTHER): Payer: Managed Care, Other (non HMO) | Admitting: Internal Medicine

## 2013-02-09 VITALS — BP 120/70 | HR 72 | Temp 98.0°F | Wt 298.0 lb

## 2013-02-09 DIAGNOSIS — I10 Essential (primary) hypertension: Secondary | ICD-10-CM

## 2013-02-09 DIAGNOSIS — Z0000 Encounter for general adult medical examination without abnormal findings: Secondary | ICD-10-CM

## 2013-02-09 DIAGNOSIS — E785 Hyperlipidemia, unspecified: Secondary | ICD-10-CM

## 2013-02-09 LAB — LIPID PANEL
Cholesterol: 239 mg/dL — ABNORMAL HIGH (ref 0–200)
HDL: 56.3 mg/dL
Total CHOL/HDL Ratio: 4
Triglycerides: 102 mg/dL (ref 0.0–149.0)
VLDL: 20.4 mg/dL (ref 0.0–40.0)

## 2013-02-09 LAB — HEPATIC FUNCTION PANEL
ALT: 34 U/L (ref 0–35)
AST: 24 U/L (ref 0–37)
Albumin: 3.6 g/dL (ref 3.5–5.2)
Alkaline Phosphatase: 51 U/L (ref 39–117)
Bilirubin, Direct: 0.1 mg/dL (ref 0.0–0.3)
Total Bilirubin: 0.4 mg/dL (ref 0.3–1.2)
Total Protein: 7.5 g/dL (ref 6.0–8.3)

## 2013-02-09 LAB — CBC WITH DIFFERENTIAL/PLATELET
Basophils Absolute: 0 10*3/uL (ref 0.0–0.1)
Basophils Relative: 0.4 % (ref 0.0–3.0)
Eosinophils Absolute: 0.2 10*3/uL (ref 0.0–0.7)
Eosinophils Relative: 1.4 % (ref 0.0–5.0)
HCT: 36.5 % (ref 36.0–46.0)
Hemoglobin: 11.8 g/dL — ABNORMAL LOW (ref 12.0–15.0)
Lymphocytes Relative: 42.5 % (ref 12.0–46.0)
Lymphs Abs: 4.5 10*3/uL — ABNORMAL HIGH (ref 0.7–4.0)
MCHC: 32.5 g/dL (ref 30.0–36.0)
MCV: 79.4 fl (ref 78.0–100.0)
Monocytes Absolute: 0.7 10*3/uL (ref 0.1–1.0)
Monocytes Relative: 6.4 % (ref 3.0–12.0)
Neutro Abs: 5.2 10*3/uL (ref 1.4–7.7)
Neutrophils Relative %: 49.3 % (ref 43.0–77.0)
Platelets: 359 10*3/uL (ref 150.0–400.0)
RBC: 4.59 Mil/uL (ref 3.87–5.11)
RDW: 15.6 % — ABNORMAL HIGH (ref 11.5–14.6)
WBC: 10.6 10*3/uL — ABNORMAL HIGH (ref 4.5–10.5)

## 2013-02-09 LAB — BASIC METABOLIC PANEL
CO2: 29 mEq/L (ref 19–32)
GFR: 120.91 mL/min (ref >60.00)
Glucose, Bld: 94 mg/dL (ref 70–99)
Potassium: 3.7 mEq/L (ref 3.5–5.1)
Sodium: 137 mEq/L (ref 135–145)

## 2013-02-09 NOTE — Assessment & Plan Note (Signed)
BP Readings from Last 3 Encounters:  02/09/13 120/70  12/15/12 116/65  11/25/12 115/65   Good control on med Had some swelling of it Due for labs

## 2013-02-09 NOTE — Progress Notes (Signed)
Subjective:    Patient ID: Lindsey Walls, female    DOB: [birth year], 42 y.o.   MRN: [id number]  HPI Here for physical Had a hard time with the hysterectomy but now finally healed up Much less physical stress  Father does have lung cancer---but doing very well after surgery and RT Still in positive relationship---don't live together  No new concerns Some headaches but not bad BP had been low when in hospital so stopped meds for a while Now back on it after she noted some edema  Current Outpatient Prescriptions on File Prior to Visit  Medication Sig Dispense Refill  . albuterol (PROVENTIL HFA;VENTOLIN HFA) 108 (90 BASE) MCG/ACT inhaler Inhale 2 puffs into the lungs every 6 (six) hours as needed.  18 g  1  . lisinopril-hydrochlorothiazide (PRINZIDE,ZESTORETIC) 10-12.5 MG per tablet Take 1 tablet by mouth daily.  90 tablet  3  . naproxen sodium (ANAPROX) 220 MG tablet Take 220 mg by mouth daily as needed (pain).       No current facility-administered medications on file prior to visit.    No Known Allergies  Past Medical History  Diagnosis Date  . Hypertension   . Hyperlipidemia   . Obesity   . Chronic back pain   . Obesity   . Bronchitis, chronic   . Anemia   . Fibroid     Past Surgical History  Procedure Laterality Date  . Cesarean section    . Tibia fracture surgery    . Cholecystectomy    . Endometrial ablation  08/08  . Robotic assisted total hysterectomy N/A 11/23/2012    Procedure: ATTEMPTED ROBOTIC ASSISTED TOTAL HYSTERECTOMY;  Surgeon: Loney Laurence, MD;  Location: [facility];  Service: Gynecology;  Laterality: N/A;  . Abdominal hysterectomy N/A 11/23/2012    Procedure: HYSTERECTOMY ABDOMINAL with repair of laproscopic torcar incision; lysis of adhesions ;  Surgeon: Loney Laurence, MD;  Location: [facility];  Service: Gynecology;  Laterality: N/A;  . Cystoscopy N/A 11/23/2012    Procedure: CYSTOSCOPY;  Surgeon: Loney Laurence, MD;  Location: [facility];   Service: Gynecology;  Laterality: N/A;  . Omentectomy N/A 11/23/2012    Procedure: OMENTECTOMY;  Surgeon: Loney Laurence, MD;  Location: [facility];  Service: Gynecology;  Laterality: N/A;    Family History  Problem Relation Age of Onset  . Coronary artery disease Mother   . Hypertension Father   . Cancer Father     lung  . Coronary artery disease Sister     History   Social History  . Marital Status: Divorced    Spouse Name: N/A    Number of Children: 2  . Years of Education: N/A   Occupational History  . Counsellor    Social History Main Topics  . Smoking status: Never Smoker   . Smokeless tobacco: Never Used  . Alcohol Use: No  . Drug Use: No  . Sexual Activity: Not on file   Other Topics Concern  . Not on file   Social History Narrative  . No narrative on file   Review of Systems  Constitutional: Negative for fatigue and unexpected weight change.       Wears seat belt  HENT: Negative for congestion, dental problem, hearing loss, rhinorrhea and tinnitus.        Regular with dentist  Eyes: Negative for visual disturbance.       No diplopia or unilateral vision loss  Respiratory: Negative for cough, chest tightness and shortness  of breath.   Cardiovascular: Negative for chest pain, palpitations and leg swelling.  Gastrointestinal: Negative for nausea, vomiting, abdominal pain, constipation and blood in stool.       No heartburn  Endocrine: Negative for cold intolerance and heat intolerance.  Genitourinary: Negative for dysuria, frequency, difficulty urinating and dyspareunia.       No concerns on occasional self breast exam  Musculoskeletal: Positive for arthralgias. Negative for back pain and joint swelling.       Some left knee pain from fall last year  Skin: Negative for rash.       No suspicious lesions  Allergic/Immunologic: Negative for environmental allergies and immunocompromised state.  Neurological: Positive for headaches. Negative for  dizziness, syncope, weakness, light-headedness and numbness.       Stress headaches  Hematological: Negative for adenopathy. Does not bruise/bleed easily.  Psychiatric/Behavioral: Negative for sleep disturbance and dysphoric mood. The patient is not nervous/anxious.        Objective:   Physical Exam  Constitutional: She is oriented to person, place, and time. She appears well-developed and well-nourished. No distress.  HENT:  Head: Normocephalic and atraumatic.  Right Ear: External ear normal.  Left Ear: External ear normal.  Mouth/Throat: Oropharynx is clear and moist. No oropharyngeal exudate.  Eyes: Conjunctivae and EOM are normal. Pupils are equal, round, and reactive to light.  Neck: Normal range of motion. Neck supple. No thyromegaly present.  Cardiovascular: Normal rate, regular rhythm, normal heart sounds and intact distal pulses.  Exam reveals no gallop.   No murmur heard. Pulmonary/Chest: Effort normal and breath sounds normal. No respiratory distress. She has no wheezes. She has no rales.  Abdominal: Soft. There is no tenderness.  Genitourinary:  Mild cystic changes in breasts --mostly periareolar  Musculoskeletal: She exhibits no edema and no tenderness.  Lymphadenopathy:    She has no cervical adenopathy.    She has no axillary adenopathy.  Neurological: She is alert and oriented to person, place, and time.  Skin: No rash noted. No erythema.  Psychiatric: She has a normal mood and affect. Her behavior is normal.          Assessment & Plan:

## 2013-02-09 NOTE — Assessment & Plan Note (Signed)
Discussed primary prevention She wants to hold off on statin for now

## 2013-02-09 NOTE — Assessment & Plan Note (Signed)
Healthy but needs to work on fitness Due for [employer]	[place]

## 2013-02-09 NOTE — Assessment & Plan Note (Signed)
Discussed exercise DASH diet info given

## 2013-02-09 NOTE — Patient Instructions (Signed)
Please set up your screening mammogram.  DASH Diet The DASH diet stands for "Dietary Approaches to Stop Hypertension." It is a healthy eating plan that has been shown to reduce high blood pressure (hypertension) in as little as 14 days, while also possibly providing other significant health benefits. These other health benefits include reducing the risk of breast cancer after menopause and reducing the risk of type 2 diabetes, heart disease, colon cancer, and stroke. Health benefits also include weight loss and slowing kidney failure in patients with chronic kidney disease.  DIET GUIDELINES  Limit salt (sodium). Your diet should contain less than 1500 mg of sodium daily.  Limit refined or processed carbohydrates. Your diet should include mostly whole grains. Desserts and added sugars should be used sparingly.  Include small amounts of heart-healthy fats. These types of fats include nuts, oils, and tub margarine. Limit saturated and trans fats. These fats have been shown to be harmful in the body. CHOOSING FOODS  The following food groups are based on a 2000 calorie diet. See your Registered Dietitian for individual calorie needs. Grains and Grain Products (6 to 8 servings daily)  Eat More Often: Whole-wheat bread, brown rice, whole-grain or wheat pasta, quinoa, popcorn without added fat or salt (air popped).  Eat Less Often: White bread, white pasta, white rice, cornbread. Vegetables (4 to 5 servings daily)  Eat More Often: Fresh, frozen, and canned vegetables. Vegetables may be raw, steamed, roasted, or grilled with a minimal amount of fat.  Eat Less Often/Avoid: Creamed or fried vegetables. Vegetables in a cheese sauce. Fruit (4 to 5 servings daily)  Eat More Often: All fresh, canned (in natural juice), or frozen fruits. Dried fruits without added sugar. One hundred percent fruit juice ( cup [237 mL] daily).  Eat Less Often: Dried fruits with added sugar. Canned fruit in light or heavy  syrup. Lean Meats, Fish, and Poultry (2 servings or less daily. One serving is 3 to 4 oz [85-114 g]).  Eat More Often: Ninety percent or leaner ground beef, tenderloin, sirloin. Round cuts of beef, chicken breast, turkey breast. All fish. Grill, bake, or broil your meat. Nothing should be fried.  Eat Less Often/Avoid: Fatty cuts of meat, turkey, or chicken leg, thigh, or wing. Fried cuts of meat or fish. Dairy (2 to 3 servings)  Eat More Often: Low-fat or fat-free milk, low-fat plain or light yogurt, reduced-fat or part-skim cheese.  Eat Less Often/Avoid: Milk (whole, 2%).Whole milk yogurt. Full-fat cheeses. Nuts, Seeds, and Legumes (4 to 5 servings per week)  Eat More Often: All without added salt.  Eat Less Often/Avoid: Salted nuts and seeds, canned beans with added salt. Fats and Sweets (limited)  Eat More Often: Vegetable oils, tub margarines without trans fats, sugar-free gelatin. Mayonnaise and salad dressings.  Eat Less Often/Avoid: Coconut oils, palm oils, butter, stick margarine, cream, half and half, cookies, candy, pie. FOR MORE INFORMATION The Dash Diet Eating Plan: www.dashdiet.org Document Released: 03/14/2011 Document Revised: 06/17/2011 Document Reviewed: 03/14/2011 ExitCare Patient Information 2014 [employer].  

## 2013-02-11 ENCOUNTER — Other Ambulatory Visit: Payer: Self-pay

## 2013-03-22 ENCOUNTER — Other Ambulatory Visit: Payer: Self-pay | Admitting: Internal Medicine

## 2013-04-09 ENCOUNTER — Encounter: Payer: Self-pay | Admitting: Internal Medicine

## 2013-04-09 ENCOUNTER — Ambulatory Visit (INDEPENDENT_AMBULATORY_CARE_PROVIDER_SITE_OTHER): Payer: Managed Care, Other (non HMO) | Admitting: Internal Medicine

## 2013-04-09 VITALS — BP 122/74 | HR 71 | Temp 98.1°F | Wt 295.0 lb

## 2013-04-09 DIAGNOSIS — J209 Acute bronchitis, unspecified: Secondary | ICD-10-CM

## 2013-04-09 MED ORDER — AZITHROMYCIN 250 MG PO TABS: ORAL_TABLET | ORAL | Status: DC

## 2013-04-09 MED ORDER — METHYLPREDNISOLONE ACETATE 80 MG/ML IJ SUSP
80.0000 mg | Freq: Once | INTRAMUSCULAR | Status: AC
  Administered 2013-04-09: 80 mg via INTRAMUSCULAR

## 2013-04-09 MED ORDER — GUAIFENESIN-CODEINE 100-10 MG/5ML PO SYRP: 5.0000 mL | ORAL_SOLUTION | Freq: Four times a day (QID) | ORAL | Status: AC | PRN

## 2013-04-09 NOTE — Progress Notes (Signed)
HPI  Pt presents to the clinic today with c/o cough and chills. This started 4 days ago. The cough is nonproductive. She has had associated headache and wheezing. She does have a history of chronic bronchitis. She has taken Mucinex, alka selzter plus and her inhaler. This has helped some. She has had sick contacts. She does not smoke.   Review of Systems      Past Medical History  Diagnosis Date  . Hypertension   . Hyperlipidemia   . Obesity   . Chronic back pain   . Obesity   . Bronchitis, chronic   . Anemia   . Fibroid     Family History  Problem Relation Age of Onset  . Coronary artery disease Mother   . Hypertension Father   . Cancer Father     lung  . Coronary artery disease Sister     History   Social History  . Marital Status: Divorced    Spouse Name: N/A    Number of Children: 2  . Years of Education: N/A   Occupational History  . Social worker    Social History Main Topics  . Smoking status: Never Smoker   . Smokeless tobacco: Never Used  . Alcohol Use: No  . Drug Use: No  . Sexual Activity: Not on file   Other Topics Concern  . Not on file   Social History Narrative  . No narrative on file    No Known Allergies   Constitutional: Positive headache, fatigue and fever. Denies abrupt weight changes.  HEENT:  Positive sore throat. Denies eye redness, eye pain, pressure behind the eyes, facial pain, nasal congestion, ear pain, ringing in the ears, wax buildup, runny nose or bloody nose. Respiratory: Positive cough. Denies difficulty breathing or shortness of breath.  Cardiovascular: Denies chest pain, chest tightness, palpitations or swelling in the hands or feet.   No other specific complaints in a complete review of systems (except as listed in HPI above).  Objective:   BP 122/74  Pulse 71  Temp(Src) 98.1 F (36.7 C) (Oral)  Wt 295 lb (133.811 kg)  SpO2 99%  LMP 10/12/2012 Wt Readings from Last 3 Encounters:  04/09/13 295 lb (133.811  kg)  02/09/13 298 lb (135.172 kg)  12/14/12 293 lb (132.904 kg)     General: Appears his stated age, well developed, well nourished in NAD. HEENT: Head: normal shape and size; Eyes: sclera white, no icterus, conjunctiva pink, PERRLA and EOMs intact; Ears: Tm's gray and intact, normal light reflex; Nose: mucosa pink and moist, septum midline; Throat/Mouth: + PND. Teeth present, mucosa erythematous and moist, no exudate noted, no lesions or ulcerations noted.  Neck: Mild cervical lymphadenopathy. Neck supple, trachea midline. No massses, lumps or thyromegaly present.  Cardiovascular: Normal rate and rhythm. S1,S2 noted.  No murmur, rubs or gallops noted. No JVD or BLE edema. No carotid bruits noted. Pulmonary/Chest: Normal effort and bilateral expiratory and inspiratory wheezing noted. No respiratory distress. No  rales or ronchi noted.      Assessment & Plan:  Acute Bronchitis  Get some rest and drink plenty of water Do salt water gargles for the sore throat eRx for Azithromax x 5 days eRx for guaf-codeine cough syrup  RTC as needed or if symptoms persist.

## 2013-04-09 NOTE — Patient Instructions (Signed)
Acute Bronchitis Bronchitis is inflammation of the airways that extend from the windpipe into the lungs (bronchi). The inflammation often causes mucus to develop. This leads to a cough, which is the most common symptom of bronchitis.  In acute bronchitis, the condition usually develops suddenly and goes away over time, usually in a couple weeks. Smoking, allergies, and asthma can make bronchitis worse. Repeated episodes of bronchitis may cause further lung problems.  CAUSES Acute bronchitis is most often caused by the same virus that causes a cold. The virus can spread from person to person (contagious).  SIGNS AND SYMPTOMS   Cough.   Fever.   Coughing up mucus.   Body aches.   Chest congestion.   Chills.   Shortness of breath.   Sore throat.  DIAGNOSIS  Acute bronchitis is usually diagnosed through a physical exam. Tests, such as chest X-rays, are sometimes done to rule out other conditions.  TREATMENT  Acute bronchitis usually goes away in a couple weeks. Often times, no medical treatment is necessary. Medicines are sometimes given for relief of fever or cough. Antibiotics are usually not needed but may be prescribed in certain situations. In some cases, an inhaler may be recommended to help reduce shortness of breath and control the cough. A cool mist vaporizer may also be used to help thin bronchial secretions and make it easier to clear the chest.  HOME CARE INSTRUCTIONS  Get plenty of rest.   Drink enough fluids to keep your urine clear or pale yellow (unless you have a medical condition that requires fluid restriction). Increasing fluids may help thin your secretions and will prevent dehydration.   Only take over-the-counter or prescription medicines as directed by your health care provider.   Avoid smoking and secondhand smoke. Exposure to cigarette smoke or irritating chemicals will make bronchitis worse. If you are a smoker, consider using nicotine gum or skin  patches to help control withdrawal symptoms. Quitting smoking will help your lungs heal faster.   Reduce the chances of another bout of acute bronchitis by washing your hands frequently, avoiding people with cold symptoms, and trying not to touch your hands to your mouth, nose, or eyes.   Follow up with your health care provider as directed.  SEEK MEDICAL CARE IF: Your symptoms do not improve after 1 week of treatment.  SEEK IMMEDIATE MEDICAL CARE IF:  You develop an increased fever or chills.   You have chest pain.   You have severe shortness of breath.  You have bloody sputum.   You develop dehydration.  You develop fainting.  You develop repeated vomiting.  You develop a severe headache. MAKE SURE YOU:   Understand these instructions.  Will watch your condition.  Will get help right away if you are not doing well or get worse. Document Released: 05/02/2004 Document Revised: 11/25/2012 Document Reviewed: 09/15/2012 Exit[employer] Patient Information 2014 [employer].  

## 2013-04-09 NOTE — Addendum Note (Signed)
Addended by: Lurlean Nanny on: 04/09/2013 04:17 PM   Modules accepted: Orders

## 2013-04-09 NOTE — Progress Notes (Signed)
Pre-visit discussion using our clinic review tool. No additional management support is needed unless otherwise documented below in the visit note.  

## 2013-04-30 ENCOUNTER — Ambulatory Visit (INDEPENDENT_AMBULATORY_CARE_PROVIDER_SITE_OTHER): Payer: Managed Care, Other (non HMO) | Admitting: Internal Medicine

## 2013-04-30 ENCOUNTER — Ambulatory Visit: Payer: Managed Care, Other (non HMO) | Admitting: Internal Medicine

## 2013-04-30 ENCOUNTER — Telehealth: Payer: Self-pay

## 2013-04-30 ENCOUNTER — Encounter: Payer: Self-pay | Admitting: Internal Medicine

## 2013-04-30 VITALS — BP 124/80 | HR 82 | Temp 99.0°F | Wt 291.0 lb

## 2013-04-30 DIAGNOSIS — J45901 Unspecified asthma with (acute) exacerbation: Secondary | ICD-10-CM

## 2013-04-30 MED ORDER — PREDNISONE 10 MG PO TABS: ORAL_TABLET | ORAL | Status: DC

## 2013-04-30 MED ORDER — HYDROCODONE-HOMATROPINE 5-1.5 MG/5ML PO SYRP: 5.0000 mL | ORAL_SOLUTION | Freq: Three times a day (TID) | ORAL | Status: DC | PRN

## 2013-04-30 NOTE — Telephone Encounter (Signed)
Pt left v/m pt was seen 04/09/13 with bronchitis; pt finished antibiotic and felt better. Today pt does not feel bad and has no fever but has started coughing and wheezing again and request refill of antibiotic so will not have to come for another appt. Spoke with pt and she scheduled appt to see Webb Silversmith NP today.

## 2013-04-30 NOTE — Progress Notes (Signed)
Pre-visit discussion using our clinic review tool. No additional management support is needed unless otherwise documented below in the visit note.  

## 2013-04-30 NOTE — Patient Instructions (Signed)

## 2013-04-30 NOTE — Progress Notes (Signed)
HPI  Pt presents to the clinic today with c/o cough and wheezing. This started a few days ago. The cough is nonproductive.. She does have a history of chronic bronchitis. She recently finished up a zpack for acute bronchitis. She has had sick contacts. She does not smoke.   Review of Systems      Past Medical History  Diagnosis Date  . Hypertension   . Hyperlipidemia   . Obesity   . Chronic back pain   . Obesity   . Bronchitis, chronic   . Anemia   . Fibroid     Family History  Problem Relation Age of Onset  . Coronary artery disease Mother   . Hypertension Father   . Cancer Father     lung  . Coronary artery disease Sister     History   Social History  . Marital Status: Divorced    Spouse Name: N/A    Number of Children: 2  . Years of Education: N/A   Occupational History  . Social worker    Social History Main Topics  . Smoking status: Never Smoker   . Smokeless tobacco: Never Used  . Alcohol Use: No  . Drug Use: No  . Sexual Activity: Not on file   Other Topics Concern  . Not on file   Social History Narrative  . No narrative on file    No Known Allergies   Constitutional:  Denies headache, fatigue, fever or abrupt weight changes.  HEENT: Denies eye redness, eye pain, pressure behind the eyes, facial pain, nasal congestion, ear pain, ringing in the ears, wax buildup, runny nose or bloody nose. Respiratory: Positive cough and wheezing. Denies difficulty breathing or shortness of breath.  Cardiovascular: Denies chest pain, chest tightness, palpitations or swelling in the hands or feet.   No other specific complaints in a complete review of systems (except as listed in HPI above).  Objective:   BP 124/80  Pulse 82  Temp(Src) 99 F (37.2 C) (Oral)  Wt 291 lb (131.997 kg)  LMP 10/12/2012 Wt Readings from Last 3 Encounters:  04/30/13 291 lb (131.997 kg)  04/09/13 295 lb (133.811 kg)  02/09/13 298 lb (135.172 kg)     General: Appears her  stated age, well developed, well nourished in NAD. HEENT: Head: normal shape and size; Eyes: sclera white, no icterus, conjunctiva pink, PERRLA and EOMs intact; Ears: Tm's gray and intact, normal light reflex; Nose: mucosa pink and moist, septum midline; Throat/Mouth: + PND. Teeth present, mucosa erythematous and moist, no exudate noted, no lesions or ulcerations noted.  Neck:. Neck supple, trachea midline. No massses, lumps or thyromegaly present.  Cardiovascular: Normal rate and rhythm. S1,S2 noted.  No murmur, rubs or gallops noted. No JVD or BLE edema. No carotid bruits noted. Pulmonary/Chest: Normal effort and bilateral expiratory and inspiratory wheezing noted. No respiratory distress. No  rales or ronchi noted.      Assessment & Plan:  Acute asthma exacerbation  Get some rest and drink plenty of water Do salt water gargles for the sore throat eRx for pred taper Rx for Hycodan cough syrup  RTC as needed or if symptoms persist.

## 2013-05-03 ENCOUNTER — Other Ambulatory Visit: Payer: Self-pay | Admitting: Internal Medicine

## 2013-05-03 ENCOUNTER — Telehealth: Payer: Self-pay

## 2013-05-03 MED ORDER — AMOXICILLIN 875 MG PO TABS: 875.0000 mg | ORAL_TABLET | Freq: Two times a day (BID) | ORAL | Status: DC

## 2013-05-03 NOTE — Telephone Encounter (Signed)
Called in amoxicillin, if no better, she needs to come in for a follow up

## 2013-05-03 NOTE — Telephone Encounter (Signed)
Pt is aware of Rx being sent to pharmacy 

## 2013-05-03 NOTE — Telephone Encounter (Signed)
Pt called requesting a Dr's note from when she was seen through 05/05/13--I got the verbal okay from [facility] and per the pt's request, the work note was faxed to pt at [phone]--pt is aware

## 2013-05-03 NOTE — Telephone Encounter (Signed)
Pt was seen on 04/30/13 and given prednisone and hycodan; pt feels worse; now runny nose, ? Sinus infection, no chest pain but still wheezing.cough with rattling seems worse today. Pt thinks has bronchitis and pt request antibiotic to [facility]. Pt has 99 temp on 05/02/13.pt cannot come in for appt due to no transportation. Pt request cb.

## 2013-05-26 ENCOUNTER — Other Ambulatory Visit: Payer: Self-pay | Admitting: Internal Medicine

## 2013-07-05 ENCOUNTER — Other Ambulatory Visit: Payer: Self-pay | Admitting: Internal Medicine

## 2013-08-17 ENCOUNTER — Encounter: Payer: Self-pay | Admitting: *Deleted

## 2013-08-17 ENCOUNTER — Telehealth: Payer: Self-pay | Admitting: *Deleted

## 2013-08-17 NOTE — Telephone Encounter (Signed)
Error in printing note, gave copy of note from 05/03/13 for job

## 2013-09-13 ENCOUNTER — Ambulatory Visit (INDEPENDENT_AMBULATORY_CARE_PROVIDER_SITE_OTHER): Payer: Managed Care, Other (non HMO) | Admitting: Internal Medicine

## 2013-09-13 ENCOUNTER — Encounter: Payer: Self-pay | Admitting: Internal Medicine

## 2013-09-13 VITALS — BP 140/80 | HR 64 | Temp 97.8°F | Resp 12 | Wt 305.0 lb

## 2013-09-13 DIAGNOSIS — J4520 Mild intermittent asthma, uncomplicated: Secondary | ICD-10-CM | POA: Insufficient documentation

## 2013-09-13 DIAGNOSIS — J209 Acute bronchitis, unspecified: Secondary | ICD-10-CM | POA: Insufficient documentation

## 2013-09-13 DIAGNOSIS — J4521 Mild intermittent asthma with (acute) exacerbation: Secondary | ICD-10-CM

## 2013-09-13 DIAGNOSIS — J45901 Unspecified asthma with (acute) exacerbation: Secondary | ICD-10-CM

## 2013-09-13 MED ORDER — HYDROCODONE-HOMATROPINE 5-1.5 MG/5ML PO SYRP: 5.0000 mL | ORAL_SOLUTION | Freq: Every evening | ORAL | Status: DC | PRN

## 2013-09-13 MED ORDER — AZITHROMYCIN 250 MG PO TABS: ORAL_TABLET | ORAL | Status: DC

## 2013-09-13 MED ORDER — PREDNISONE 20 MG PO TABS: 40.0000 mg | ORAL_TABLET | Freq: Every day | ORAL | Status: DC

## 2013-09-13 NOTE — Assessment & Plan Note (Signed)
Generally only has problems with infections Will give short course of prednisone More cough syrup

## 2013-09-13 NOTE — Progress Notes (Signed)
Pre visit review using our clinic review tool, if applicable. No additional management support is needed unless otherwise documented below in the visit note. 

## 2013-09-13 NOTE — Assessment & Plan Note (Signed)
May well be viral Will treat the bronchospasm Z-pak only if cough productive

## 2013-09-13 NOTE — Progress Notes (Signed)
Subjective:    Patient ID: Lindsey Walls, female    DOB: 1970-11-30, 43 y.o.   MRN: [id number]  HPI Having bronchitis symptoms Also with sore throat for several days--not typical with her bronchitis Lots of cough--mostly dry  Has been awakening with wheezing Has increased SOB Does use the albuterol--only gives brief improvement Has chest tightness with cough  No cough Some sweats at night --no fever  Using alka seltzer Using left over hydrocodone cough syrup  Current Outpatient Prescriptions on File Prior to Visit  Medication Sig Dispense Refill  . lisinopril-hydrochlorothiazide (PRINZIDE,ZESTORETIC) 10-12.5 MG per tablet take 1 tablet by mouth once daily  90 tablet  3  . naproxen (NAPROSYN) 500 MG tablet take 1 tablet by mouth twice a day with food  60 tablet  11  . PROAIR HFA 108 (90 BASE) MCG/ACT inhaler inhale 2 puffs by mouth every 6 hours if needed  18 g  1   No current facility-administered medications on file prior to visit.    No Known Allergies  Past Medical History  Diagnosis Date  . Hypertension   . Hyperlipidemia   . Obesity   . Chronic back pain   . Obesity   . Bronchitis, chronic   . Anemia   . Fibroid     Past Surgical History  Procedure Laterality Date  . Cesarean section    . Tibia fracture surgery    . Cholecystectomy    . Endometrial ablation  08/08  . Robotic assisted total hysterectomy N/A 11/23/2012    Procedure: ATTEMPTED ROBOTIC ASSISTED TOTAL HYSTERECTOMY;  Surgeon: Daria Pastures, MD;  Location: [facility];  Service: Gynecology;  Laterality: N/A;  . Abdominal hysterectomy N/A 11/23/2012    Procedure: HYSTERECTOMY ABDOMINAL with repair of laproscopic torcar incision; lysis of adhesions ;  Surgeon: Daria Pastures, MD;  Location: [facility];  Service: Gynecology;  Laterality: N/A;  . Cystoscopy N/A 11/23/2012    Procedure: CYSTOSCOPY;  Surgeon: Daria Pastures, MD;  Location: [facility];  Service: Gynecology;  Laterality: N/A;  .  Omentectomy N/A 11/23/2012    Procedure: OMENTECTOMY;  Surgeon: Daria Pastures, MD;  Location: [facility];  Service: Gynecology;  Laterality: N/A;    Family History  Problem Relation Age of Onset  . Coronary artery disease Mother   . Hypertension Father   . Cancer Father     lung  . Coronary artery disease Sister     History   Social History  . Marital Status: Divorced    Spouse Name: N/A    Number of Children: 2  . Years of Education: N/A   Occupational History  . Social worker    Social History Main Topics  . Smoking status: Never Smoker   . Smokeless tobacco: Never Used  . Alcohol Use: No  . Drug Use: No  . Sexual Activity: Not on file   Other Topics Concern  . Not on file   Social History Narrative  . No narrative on file   Review of Systems Left ear pain Several people at work have been sick for extended times No headache or sig nasal congestion    Objective:   Physical Exam  Constitutional: She appears well-developed and well-nourished. No distress.  HENT:  Mouth/Throat: Oropharynx is clear and moist. No oropharyngeal exudate.  No sinus tenderness TMs normal Moderate nasal congestion   Neck: Normal range of motion. Neck supple. No thyromegaly present.  Pulmonary/Chest: Effort normal and breath sounds normal. No respiratory  distress. She has no wheezes. She has no rales.  Lymphadenopathy:    She has no cervical adenopathy.          Assessment & Plan:

## 2014-02-07 ENCOUNTER — Encounter: Payer: Self-pay | Admitting: Internal Medicine

## 2014-03-25 ENCOUNTER — Encounter: Payer: Managed Care, Other (non HMO) | Admitting: Internal Medicine

## 2014-03-30 ENCOUNTER — Encounter: Payer: Managed Care, Other (non HMO) | Admitting: Internal Medicine

## 2014-04-06 ENCOUNTER — Other Ambulatory Visit: Payer: Self-pay | Admitting: Internal Medicine

## 2014-05-06 ENCOUNTER — Telehealth: Payer: Self-pay | Admitting: Internal Medicine

## 2014-05-06 NOTE — Telephone Encounter (Signed)
This is not overly concerning BP Okay to wait for next week

## 2014-05-06 NOTE — Telephone Encounter (Signed)
Pt has appt on 05/10/14 with Dr Silvio Pate already scheduled.

## 2014-05-06 NOTE — Telephone Encounter (Signed)
Patient Name: Lindsey Walls DOB: 1970-09-27 Initial Comment Caller states they have a blood pressure of 147 over 95. The office made them an appointment but they need to speak with a nurse. Nurse Assessment Nurse: Raphael Gibney, RN, Vanita Ingles Date/Time Eilene Ghazi Time): 05/06/2014 12:21:03 PM Confirm and document reason for call. If symptomatic, describe symptoms. ---Caller states she has appt on Tuesday. BO [place] 147/95. she has been monitoring BP for 2 weeks. She is walking 2.5 miles a day. she is taking BP medication. She is feeling good. Has a little bit of edema in her ankles. When she props her feet, the swelling goes down. Has the patient traveled out of the country within the last 30 days? ---Not Applicable Does the patient require triage? ---Yes Related visit to physician within the last 2 weeks? ---No Does the PT have any chronic conditions? (i.e. diabetes, asthma, etc.) ---Yes List chronic conditions. ---HTN Did the patient indicate they were pregnant? ---No Guidelines Guideline Title Affirmed Question Affirmed Notes High Blood Pressure [1] BP # 140/90 AND [2] taking BP medications Final Disposition User See PCP within 2 Maudry Diego, RN, Vanita Ingles Comments BP is 147/95.

## 2014-05-06 NOTE — Telephone Encounter (Signed)
PLEASE NOTE: All timestamps contained within this report are represented as [place] Standard Time. CONFIDENTIALTY NOTICE: This fax transmission is intended only for the addressee. It contains information that is legally privileged, confidential or otherwise protected from use or disclosure. If you are not the intended recipient, you are strictly prohibited from reviewing, disclosing, copying using or disseminating any of this information or taking any action in reliance on or regarding this information. If you have received this fax in error, please notify us immediately by telephone so that we can arrange for its return to [place]. Phone: [phone], Toll-Free: [phone], Fax: [phone] Page: 1 of 2 Call Id: [id number] [place] Patient Name: Lindsey Walls Gender: Female DOB: Jul 03, 1970 Age: 44 Y 2 M 5 D Return Phone Number: [phone] (Primary) Address: [address] City/State/Zip: [place] [address] Client [facility] - Client Client Site [place] - Day Physician Viviana Simpler Contact Type Call Call Type Triage / Clinical Relationship To Patient Self Appointment Disposition EMR Appointment Not Necessary Return Phone Number [phone] (Primary) Chief Complaint Blood Pressure High Initial Comment Caller states they have a blood pressure of 147 over 95. The office made them an appointment but they need to speak with a nurse. PreDisposition Did not know what to do Info pasted into Epic Yes Nurse Assessment Nurse: Raphael Gibney, RN, Vanita Ingles Date/Time (Eastern Time): 05/06/2014 12:21:03 PM Confirm and document reason for call. If symptomatic, describe symptoms. ---Caller states she has appt on Tuesday. BO [place] 147/95. she has been monitoring BP for 2 weeks. She is walking 2.5 miles a day. she is taking BP medication. She is feeling good. Has a  little bit of edema in her ankles. When she props her feet, the swelling goes down. Has the patient traveled out of the country within the last 30 days? ---Not Applicable Does the patient require triage? ---Yes Related visit to physician within the last 2 weeks? ---No Does the PT have any chronic conditions? (i.e. diabetes, asthma, etc.) ---Yes List chronic conditions. ---HTN Did the patient indicate they were pregnant? ---No Guidelines Guideline Title Affirmed Question Affirmed Notes Nurse Date/Time (Eastern Time) High Blood Pressure [1] BP # 140/90 AND [2] taking BP medications Raphael Gibney, RN, Vanita Ingles 05/06/2014 12:26:19 PM Disp. Time Eilene Ghazi Time) Disposition Final User 05/06/2014 12:29:56 PM See PCP within 2 Weeks Yes Raphael Gibney, RN, Vanita Ingles PLEASE NOTE: All timestamps contained within this report are represented as [place] Standard Time. CONFIDENTIALTY NOTICE: This fax transmission is intended only for the addressee. It contains information that is legally privileged, confidential or otherwise protected from use or disclosure. If you are not the intended recipient, you are strictly prohibited from reviewing, disclosing, copying using or disseminating any of this information or taking any action in reliance on or regarding this information. If you have received this fax in error, please notify us immediately by telephone so that we can arrange for its return to [place]. Phone: [phone], Toll-Free: [phone], Fax: [phone] Page: 2 of 2 Call Id: [id number] Caller Understands: Yes Disagree/Comply: Comply Care Advice Given Per Guideline SEE PCP WITHIN 2 WEEKS: You need an evaluation for this ongoing problem within the next 2 weeks. Call your doctor during regular office hours and make an appointment. REASSURANCE: * Your blood pressure is elevated but you have told me that you are not having any symptoms. LIFESTYLE MODIFICATIONS - The following things can help you reduce your blood  pressure: * EAT HEALTHY: Eat a diet rich in fresh fruits and vegetables, dietary fiber, non-animal protein (e.g., soy), and low-fat dairy products. Avoid foods with a high content of saturated fat or cholesterol. * DECREASE SODIUM INTAKE: Aim to eat less than 1,500 mg of sodium each day. Unfortunately 75% of the salt in the average person's diet is in pre-processed foods. * EXERCISE, BE MORE PHYSICALLY ACTIVE: Do 30-60 minutes of moderate intensity exercise four to seven times a week. Examples include aerobic activities like brisk walking, cycling, and swimming. CARE ADVICE given per High Blood Pressure (Adult) guideline. CALL BACK IF: * Weakness or numbness of the face, arm or leg on one side of the body occurs * Difficulty walking, difficulty talking, or severe headache occurs * Chest pain or difficulty breathing occurs * Your blood pressure is over 160/100 * You become worse. After Care Instructions Given Call Event Type User Date / Time Description Comments User: Dannielle Burn, RN Date/Time Eilene Ghazi Time): 05/06/2014 12:36:37 PM BP is 147/95. User: Dannielle Burn, RN Date/Time Eilene Ghazi Time): 05/06/2014 12:40:18 PM Office has already scheduled her an appt for next Tuesday.

## 2014-05-10 ENCOUNTER — Encounter: Payer: Self-pay | Admitting: Internal Medicine

## 2014-05-10 ENCOUNTER — Ambulatory Visit (INDEPENDENT_AMBULATORY_CARE_PROVIDER_SITE_OTHER): Payer: 59 | Admitting: Internal Medicine

## 2014-05-10 VITALS — BP 140/94 | HR 76 | Temp 98.6°F | Wt 303.5 lb

## 2014-05-10 DIAGNOSIS — I10 Essential (primary) hypertension: Secondary | ICD-10-CM

## 2014-05-10 MED ORDER — LISINOPRIL-HYDROCHLOROTHIAZIDE 20-25 MG PO TABS: 1.0000 | ORAL_TABLET | Freq: Every day | ORAL | Status: DC

## 2014-05-10 NOTE — Progress Notes (Signed)
Subjective:    Patient ID: Lindsey Walls Seen, female    DOB: Jul 16, 1970, 44 y.o.   MRN: [id number]  HPI Here due to elevations of BP  Feels great Walking 2 miles per day---more energy 147-155/93-101 at [facility]  No headaches--except very slight feeling in AM that quickly goes away Might have slight chest pain after eating spicy food Still some leg swelling  Urine very concentrated in AM Slight smell No symptoms in day  Current Outpatient Prescriptions on File Prior to Visit  Medication Sig Dispense Refill  . lisinopril-hydrochlorothiazide (PRINZIDE,ZESTORETIC) 10-12.5 MG per tablet take 1 tablet by mouth once daily 90 tablet 3  . naproxen (NAPROSYN) 500 MG tablet take 1 tablet by mouth twice a day with food 60 tablet 11  . PROAIR HFA 108 (90 BASE) MCG/ACT inhaler inhale 2 puffs by mouth every 6 hours if needed 18 g 1  . HYDROcodone-homatropine (HYCODAN) 5-1.5 MG/5ML syrup Take 5 mLs by mouth at bedtime as needed for cough. (Patient not taking: Reported on 05/10/2014) 120 mL 0   No current facility-administered medications on file prior to visit.    No Known Allergies  Past Medical History  Diagnosis Date  . Hypertension   . Hyperlipidemia   . Obesity   . Chronic back pain   . Obesity   . Bronchitis, chronic   . Anemia   . Fibroid     Past Surgical History  Procedure Laterality Date  . Cesarean section    . Tibia fracture surgery    . Cholecystectomy    . Endometrial ablation  08/08  . Robotic assisted total hysterectomy N/A 11/23/2012    Procedure: ATTEMPTED ROBOTIC ASSISTED TOTAL HYSTERECTOMY;  Surgeon: Daria Pastures, MD;  Location: [facility];  Service: Gynecology;  Laterality: N/A;  . Abdominal hysterectomy N/A 11/23/2012    Procedure: HYSTERECTOMY ABDOMINAL with repair of laproscopic torcar incision; lysis of adhesions ;  Surgeon: Daria Pastures, MD;  Location: [facility];  Service: Gynecology;  Laterality: N/A;  . Cystoscopy N/A 11/23/2012    Procedure:  CYSTOSCOPY;  Surgeon: Daria Pastures, MD;  Location: [facility];  Service: Gynecology;  Laterality: N/A;  . Omentectomy N/A 11/23/2012    Procedure: OMENTECTOMY;  Surgeon: Daria Pastures, MD;  Location: [facility];  Service: Gynecology;  Laterality: N/A;    Family History  Problem Relation Age of Onset  . Coronary artery disease Mother   . Hypertension Father   . Cancer Father     lung  . Coronary artery disease Sister     History   Social History  . Marital Status: Married    Spouse Name: N/A    Number of Children: 2  . Years of Education: N/A   Occupational History  . Social worker    Social History Main Topics  . Smoking status: Never Smoker   . Smokeless tobacco: Never Used  . Alcohol Use: No  . Drug Use: No  . Sexual Activity: Not on file   Other Topics Concern  . Not on file   Social History Narrative   Review of Systems Gave up tea Sleeping great!    Objective:   Physical Exam  Constitutional: She appears well-developed and well-nourished. No distress.  Neck: Normal range of motion. Neck supple. No thyromegaly present.  Cardiovascular: Normal rate, regular rhythm and normal heart sounds.  Exam reveals no gallop.   No murmur heard. Pulmonary/Chest: Effort normal and breath sounds normal. No respiratory distress. She has no  wheezes. She has no rales.  Musculoskeletal: She exhibits no edema or tenderness.  Lymphadenopathy:    She has no cervical adenopathy.  Psychiatric: She has a normal mood and affect. Her behavior is normal.          Assessment & Plan:

## 2014-05-10 NOTE — Progress Notes (Signed)
Pre visit review using our clinic review tool, if applicable. No additional management support is needed unless otherwise documented below in the visit note. 

## 2014-05-10 NOTE — Assessment & Plan Note (Signed)
BP Readings from Last 3 Encounters:  05/10/14 140/94  09/13/13 140/80  04/30/13 124/80   Repeat on right 130/100 Will double the med Recheck at physical

## 2014-05-10 NOTE — Patient Instructions (Signed)
DASH Eating Plan °DASH stands for "Dietary Approaches to Stop Hypertension." The DASH eating plan is a healthy eating plan that has been shown to reduce high blood pressure (hypertension). Additional health benefits may include reducing the risk of type 2 diabetes mellitus, heart disease, and stroke. The DASH eating plan may also help with weight loss. °WHAT DO I NEED TO KNOW ABOUT THE DASH EATING PLAN? °For the DASH eating plan, you will follow these general guidelines: °· Choose foods with a percent daily value for sodium of less than 5% (as listed on the food label). °· Use salt-free seasonings or herbs instead of table salt or sea salt. °· Check with your health care provider or pharmacist before using salt substitutes. °· Eat lower-sodium products, often labeled as "lower sodium" or "no salt added." °· Eat fresh foods. °· Eat more vegetables, fruits, and low-fat dairy products. °· Choose whole grains. Look for the word "whole" as the first word in the ingredient list. °· Choose fish and skinless chicken or turkey more often than red meat. Limit fish, poultry, and meat to 6 oz (170 g) each day. °· Limit sweets, desserts, sugars, and sugary drinks. °· Choose heart-healthy fats. °· Limit cheese to 1 oz (28 g) per day. °· Eat more home-cooked food and less restaurant, buffet, and fast food. °· Limit fried foods. °· Cook foods using methods other than frying. °· Limit canned vegetables. If you do use them, rinse them well to decrease the sodium. °· When eating at a restaurant, ask that your food be prepared with less salt, or no salt if possible. °WHAT FOODS CAN I EAT? °Seek help from a dietitian for individual calorie needs. °Grains °Whole grain or whole wheat bread. Brown rice. Whole grain or whole wheat pasta. Quinoa, bulgur, and whole grain cereals. Low-sodium cereals. Corn or whole wheat flour tortillas. Whole grain cornbread. Whole grain crackers. Low-sodium crackers. °Vegetables °Fresh or frozen vegetables  (raw, steamed, roasted, or grilled). Low-sodium or reduced-sodium tomato and vegetable juices. Low-sodium or reduced-sodium tomato sauce and paste. Low-sodium or reduced-sodium canned vegetables.  °Fruits °All fresh, canned (in natural juice), or frozen fruits. °Meat and Other Protein Products °Ground beef (85% or leaner), grass-fed beef, or beef trimmed of fat. Skinless chicken or turkey. Ground chicken or turkey. Pork trimmed of fat. All fish and seafood. Eggs. Dried beans, peas, or lentils. Unsalted nuts and seeds. Unsalted canned beans. °Dairy °Low-fat dairy products, such as skim or 1% milk, 2% or reduced-fat cheeses, low-fat ricotta or cottage cheese, or plain low-fat yogurt. Low-sodium or reduced-sodium cheeses. °Fats and Oils °Tub margarines without trans fats. Light or reduced-fat mayonnaise and salad dressings (reduced sodium). Avocado. Safflower, olive, or canola oils. Natural peanut or almond butter. °Other °Unsalted popcorn and pretzels. °The items listed above may not be a complete list of recommended foods or beverages. Contact your dietitian for more options. °WHAT FOODS ARE NOT RECOMMENDED? °Grains °White bread. White pasta. White rice. Refined cornbread. Bagels and croissants. Crackers that contain trans fat. °Vegetables °Creamed or fried vegetables. Vegetables in a cheese sauce. Regular canned vegetables. Regular canned tomato sauce and paste. Regular tomato and vegetable juices. °Fruits °Dried fruits. Canned fruit in light or heavy syrup. Fruit juice. °Meat and Other Protein Products °Fatty cuts of meat. Ribs, chicken wings, bacon, sausage, bologna, salami, chitterlings, fatback, hot dogs, bratwurst, and packaged luncheon meats. Salted nuts and seeds. Canned beans with salt. °Dairy °Whole or 2% milk, cream, half-and-half, and cream cheese. Whole-fat or sweetened yogurt. Full-fat   cheeses or blue cheese. Nondairy creamers and whipped toppings. Processed cheese, cheese spreads, or cheese  curds. °Condiments °Onion and garlic salt, seasoned salt, table salt, and sea salt. Canned and packaged gravies. Worcestershire sauce. Tartar sauce. Barbecue sauce. Teriyaki sauce. Soy sauce, including reduced sodium. Steak sauce. Fish sauce. Oyster sauce. Cocktail sauce. Horseradish. Ketchup and mustard. Meat flavorings and tenderizers. Bouillon cubes. Hot sauce. Tabasco sauce. Marinades. Taco seasonings. Relishes. °Fats and Oils °Butter, stick margarine, lard, shortening, ghee, and bacon fat. Coconut, palm kernel, or palm oils. Regular salad dressings. °Other °Pickles and olives. Salted popcorn and pretzels. °The items listed above may not be a complete list of foods and beverages to avoid. Contact your dietitian for more information. °WHERE CAN I FIND MORE INFORMATION? °National Heart, Lung, and [employer] Institute: www.nhlbi.nih.gov/health/health-topics/topics/dash/ °Document Released: 03/14/2011 Document Revised: 08/09/2013 Document Reviewed: 01/27/2013 °ExitCare® Patient Information ©2015 [employer]. This information is not intended to replace advice given to you by your health care provider. Make sure you discuss any questions you have with your health care provider. ° °

## 2014-05-11 ENCOUNTER — Telehealth: Payer: Self-pay | Admitting: Internal Medicine

## 2014-05-11 NOTE — Telephone Encounter (Signed)
emmi emailed °

## 2014-06-21 LAB — HM MAMMOGRAPHY: HM MAMMO: NORMAL

## 2014-08-03 ENCOUNTER — Other Ambulatory Visit: Payer: Self-pay | Admitting: *Deleted

## 2014-08-03 MED ORDER — NAPROXEN 500 MG PO TABS: 500.0000 mg | ORAL_TABLET | Freq: Two times a day (BID) | ORAL | Status: DC

## 2014-08-03 NOTE — Telephone Encounter (Signed)
OV 05/10/14.  Last filled #60 x11 refills 07/05/13.

## 2014-08-05 ENCOUNTER — Encounter: Payer: Self-pay | Admitting: *Deleted

## 2014-08-05 ENCOUNTER — Ambulatory Visit (INDEPENDENT_AMBULATORY_CARE_PROVIDER_SITE_OTHER): Payer: 59 | Admitting: Internal Medicine

## 2014-08-05 ENCOUNTER — Encounter: Payer: Self-pay | Admitting: Internal Medicine

## 2014-08-05 VITALS — BP 130/80 | HR 86 | Temp 97.6°F | Ht 66.0 in | Wt 305.0 lb

## 2014-08-05 DIAGNOSIS — E785 Hyperlipidemia, unspecified: Secondary | ICD-10-CM

## 2014-08-05 DIAGNOSIS — Z0000 Encounter for general adult medical examination without abnormal findings: Secondary | ICD-10-CM | POA: Diagnosis not present

## 2014-08-05 DIAGNOSIS — I10 Essential (primary) hypertension: Secondary | ICD-10-CM

## 2014-08-05 LAB — CBC WITH DIFFERENTIAL/PLATELET
BASOS ABS: 0 10*3/uL (ref 0.0–0.1)
Basophils Relative: 0.4 % (ref 0.0–3.0)
EOS ABS: 0.1 10*3/uL (ref 0.0–0.7)
EOS PCT: 0.9 % (ref 0.0–5.0)
HCT: 38.1 % (ref 36.0–46.0)
HEMOGLOBIN: 12.5 g/dL (ref 12.0–15.0)
LYMPHS PCT: 37.7 % (ref 12.0–46.0)
Lymphs Abs: 4.1 10*3/uL — ABNORMAL HIGH (ref 0.7–4.0)
MCHC: 32.8 g/dL (ref 30.0–36.0)
MCV: 81.3 fl (ref 78.0–100.0)
Monocytes Absolute: 0.7 10*3/uL (ref 0.1–1.0)
Monocytes Relative: 6.5 % (ref 3.0–12.0)
NEUTROS ABS: 5.9 10*3/uL (ref 1.4–7.7)
Neutrophils Relative %: 54.5 % (ref 43.0–77.0)
Platelets: 335 10*3/uL (ref 150.0–400.0)
RBC: 4.68 Mil/uL (ref 3.87–5.11)
RDW: 14.1 % (ref 11.5–15.5)
WBC: 10.9 10*3/uL — AB (ref 4.0–10.5)

## 2014-08-05 LAB — LIPID PANEL
CHOL/HDL RATIO: 4
Cholesterol: 222 mg/dL — ABNORMAL HIGH (ref 0–200)
HDL: 52.1 mg/dL (ref >39.00)
LDL Cholesterol: 153 mg/dL — ABNORMAL HIGH (ref 0–99)
NonHDL: 169.9
Triglycerides: 85 mg/dL (ref 0.0–149.0)
VLDL: 17 mg/dL (ref 0.0–40.0)

## 2014-08-05 LAB — COMPREHENSIVE METABOLIC PANEL
ALBUMIN: 3.7 g/dL (ref 3.5–5.2)
ALK PHOS: 63 U/L (ref 39–117)
ALT: 113 U/L — AB (ref 0–35)
AST: 54 U/L — ABNORMAL HIGH (ref 0–37)
BUN: 12 mg/dL (ref 6–23)
CALCIUM: 9.1 mg/dL (ref 8.4–10.5)
CO2: 32 meq/L (ref 19–32)
Chloride: 101 mEq/L (ref 96–112)
Creatinine, Ser: 0.64 mg/dL (ref 0.40–1.20)
GFR: 128.8 mL/min (ref >60.00)
Glucose, Bld: 93 mg/dL (ref 70–99)
POTASSIUM: 3.8 meq/L (ref 3.5–5.1)
Sodium: 139 mEq/L (ref 135–145)
TOTAL PROTEIN: 7.1 g/dL (ref 6.0–8.3)
Total Bilirubin: 0.6 mg/dL (ref 0.2–1.2)

## 2014-08-05 LAB — T4, FREE: FREE T4: 0.61 ng/dL (ref 0.60–1.60)

## 2014-08-05 MED ORDER — LISINOPRIL-HYDROCHLOROTHIAZIDE 20-25 MG PO TABS: 1.0000 | ORAL_TABLET | Freq: Every day | ORAL | Status: DC

## 2014-08-05 NOTE — Assessment & Plan Note (Signed)
Discussed primary prevention---she prefers no statin

## 2014-08-05 NOTE — Patient Instructions (Signed)
DASH Eating Plan DASH stands for "Dietary Approaches to Stop Hypertension." The DASH eating plan is a healthy eating plan that has been shown to reduce high blood pressure (hypertension). Additional health benefits may include reducing the risk of type 2 diabetes mellitus, heart disease, and stroke. The DASH eating plan may also help with weight loss. WHAT DO I NEED TO KNOW ABOUT THE DASH EATING PLAN? For the DASH eating plan, you will follow these general guidelines:  Choose foods with a percent daily value for sodium of less than 5% (as listed on the food label).  Use salt-free seasonings or herbs instead of table salt or sea salt.  Check with your health care provider or pharmacist before using salt substitutes.  Eat lower-sodium products, often labeled as "lower sodium" or "no salt added."  Eat fresh foods.  Eat more vegetables, fruits, and low-fat dairy products.  Choose whole grains. Look for the word "whole" as the first word in the ingredient list.  Choose fish and skinless chicken or turkey more often than red meat. Limit fish, poultry, and meat to 6 oz (170 g) each day.  Limit sweets, desserts, sugars, and sugary drinks.  Choose heart-healthy fats.  Limit cheese to 1 oz (28 g) per day.  Eat more home-cooked food and less restaurant, buffet, and fast food.  Limit fried foods.  Cook foods using methods other than frying.  Limit canned vegetables. If you do use them, rinse them well to decrease the sodium.  When eating at a restaurant, ask that your food be prepared with less salt, or no salt if possible. WHAT FOODS CAN I EAT? Seek help from a dietitian for individual calorie needs. Grains Whole grain or whole wheat bread. Brown rice. Whole grain or whole wheat pasta. Quinoa, bulgur, and whole grain cereals. Low-sodium cereals. Corn or whole wheat flour tortillas. Whole grain cornbread. Whole grain crackers. Low-sodium crackers. Vegetables Fresh or frozen vegetables  (raw, steamed, roasted, or grilled). Low-sodium or reduced-sodium tomato and vegetable juices. Low-sodium or reduced-sodium tomato sauce and paste. Low-sodium or reduced-sodium canned vegetables.  Fruits All fresh, canned (in natural juice), or frozen fruits. Meat and Other Protein Products Ground beef (85% or leaner), grass-fed beef, or beef trimmed of fat. Skinless chicken or turkey. Ground chicken or turkey. Pork trimmed of fat. All fish and seafood. Eggs. Dried beans, peas, or lentils. Unsalted nuts and seeds. Unsalted canned beans. Dairy Low-fat dairy products, such as skim or 1% milk, 2% or reduced-fat cheeses, low-fat ricotta or cottage cheese, or plain low-fat yogurt. Low-sodium or reduced-sodium cheeses. Fats and Oils Tub margarines without trans fats. Light or reduced-fat mayonnaise and salad dressings (reduced sodium). Avocado. Safflower, olive, or canola oils. Natural peanut or almond butter. Other Unsalted popcorn and pretzels. The items listed above may not be a complete list of recommended foods or beverages. Contact your dietitian for more options. WHAT FOODS ARE NOT RECOMMENDED? Grains White bread. White pasta. White rice. Refined cornbread. Bagels and croissants. Crackers that contain trans fat. Vegetables Creamed or fried vegetables. Vegetables in a cheese sauce. Regular canned vegetables. Regular canned tomato sauce and paste. Regular tomato and vegetable juices. Fruits Dried fruits. Canned fruit in light or heavy syrup. Fruit juice. Meat and Other Protein Products Fatty cuts of meat. Ribs, chicken wings, bacon, sausage, bologna, salami, chitterlings, fatback, hot dogs, bratwurst, and packaged luncheon meats. Salted nuts and seeds. Canned beans with salt. Dairy Whole or 2% milk, cream, half-and-half, and cream cheese. Whole-fat or sweetened yogurt. Full-fat   cheeses or blue cheese. Nondairy creamers and whipped toppings. Processed cheese, cheese spreads, or cheese  curds. Condiments Onion and garlic salt, seasoned salt, table salt, and sea salt. Canned and packaged gravies. Worcestershire sauce. Tartar sauce. Barbecue sauce. Teriyaki sauce. Soy sauce, including reduced sodium. Steak sauce. Fish sauce. Oyster sauce. Cocktail sauce. Horseradish. Ketchup and mustard. Meat flavorings and tenderizers. Bouillon cubes. Hot sauce. Tabasco sauce. Marinades. Taco seasonings. Relishes. Fats and Oils Butter, stick margarine, lard, shortening, ghee, and bacon fat. Coconut, palm kernel, or palm oils. Regular salad dressings. Other Pickles and olives. Salted popcorn and pretzels. The items listed above may not be a complete list of foods and beverages to avoid. Contact your dietitian for more information. WHERE CAN I FIND MORE INFORMATION? National Heart, Lung, and Blood Institute: www.nhlbi.nih.gov/health/health-topics/topics/dash/ Document Released: 03/14/2011 Document Revised: 08/09/2013 Document Reviewed: 01/27/2013 ExitCare Patient Information 2015 [employer]. This information is not intended to replace advice given to you by your health care provider. Make sure you discuss any questions you have with your health care provider. Exercise to Lose Weight Exercise and a healthy diet may help you lose weight. Your doctor may suggest specific exercises. EXERCISE IDEAS AND TIPS  Choose low-cost things you enjoy doing, such as walking, bicycling, or exercising to workout videos.  Take stairs instead of the elevator.  Walk during your lunch break.  Park your car further away from work or school.  Go to a gym or an exercise class.  Start with 5 to 10 minutes of exercise each day. Build up to 30 minutes of exercise 4 to 6 days a week.  Wear shoes with good support and comfortable clothes.  Stretch before and after working out.  Work out until you breathe harder and your heart beats faster.  Drink extra water when you exercise.  Do not do so much that you  hurt yourself, feel dizzy, or get very short of breath. Exercises that burn about 150 calories:  Running 1  miles in 15 minutes.  Playing volleyball for 45 to 60 minutes.  Washing and waxing a car for 45 to 60 minutes.  Playing touch football for 45 minutes.  Walking 1  miles in 35 minutes.  Pushing a stroller 1  miles in 30 minutes.  Playing basketball for 30 minutes.  Raking leaves for 30 minutes.  Bicycling 5 miles in 30 minutes.  Walking 2 miles in 30 minutes.  Dancing for 30 minutes.  Shoveling snow for 15 minutes.  Swimming laps for 20 minutes.  Walking up stairs for 15 minutes.  Bicycling 4 miles in 15 minutes.  Gardening for 30 to 45 minutes.  Jumping rope for 15 minutes.  Washing windows or floors for 45 to 60 minutes. Document Released: 04/27/2010 Document Revised: 06/17/2011 Document Reviewed: 04/27/2010 ExitCare Patient Information 2015 [employer]. This information is not intended to replace advice given to you by your health care provider. Make sure you discuss any questions you have with your health care provider.  

## 2014-08-05 NOTE — Progress Notes (Signed)
Subjective:    Patient ID: Lindsey Walls, female    DOB: 08/25/1970, 44 y.o.   MRN: [id number]  HPI Here for physical Happy with new job No man now Son in college--daughter 39  Blood pressure is better Edema is gone Doesn't check her own BP  Did have check with gyn Had pap (though doesn't need it) and breast exam Mammogram fine  Working on improved eating Has cut back on sweet tea and fried food (and fast food) In walking club--- doing 3K walk for charity, etc  Current Outpatient Prescriptions on File Prior to Visit  Medication Sig Dispense Refill  . naproxen (NAPROSYN) 500 MG tablet Take 1 tablet (500 mg total) by mouth 2 (two) times daily with a meal. 60 tablet 5  . PROAIR HFA 108 (90 BASE) MCG/ACT inhaler inhale 2 puffs by mouth every 6 hours if needed 18 g 1   No current facility-administered medications on file prior to visit.    No Known Allergies  Past Medical History  Diagnosis Date  . Hypertension   . Hyperlipidemia   . Obesity   . Chronic back pain   . Obesity   . Bronchitis, chronic   . Anemia   . Fibroid     Past Surgical History  Procedure Laterality Date  . Cesarean section    . Tibia fracture surgery    . Cholecystectomy    . Endometrial ablation  08/08  . Robotic assisted total hysterectomy N/A 11/23/2012    Procedure: ATTEMPTED ROBOTIC ASSISTED TOTAL HYSTERECTOMY;  Surgeon: Daria Pastures, MD;  Location: [facility];  Service: Gynecology;  Laterality: N/A;  . Abdominal hysterectomy N/A 11/23/2012    Procedure: HYSTERECTOMY ABDOMINAL with repair of laproscopic torcar incision; lysis of adhesions ;  Surgeon: Daria Pastures, MD;  Location: [facility];  Service: Gynecology;  Laterality: N/A;  . Cystoscopy N/A 11/23/2012    Procedure: CYSTOSCOPY;  Surgeon: Daria Pastures, MD;  Location: [facility];  Service: Gynecology;  Laterality: N/A;  . Omentectomy N/A 11/23/2012    Procedure: OMENTECTOMY;  Surgeon: Daria Pastures, MD;  Location: [facility];   Service: Gynecology;  Laterality: N/A;    Family History  Problem Relation Age of Onset  . Coronary artery disease Mother   . Hypertension Father   . Cancer Father     lung  . Coronary artery disease Sister     History   Social History  . Marital Status: Married    Spouse Name: N/A  . Number of Children: 2  . Years of Education: N/A   Occupational History  . 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 Smoking status: Never Smoker   . Smokeless tobacco: Never Used  . Alcohol Use: No  . Drug Use: No  . Sexual Activity: Not on file   Other Topics Concern  . Not on file   Social History Narrative   Review of Systems  Constitutional: Negative for fatigue and unexpected weight change.       Wears seat belt  HENT: Negative for dental problem, hearing loss and tinnitus.        Keeps up with dentist Taste has changed some  Eyes: Negative for visual disturbance.       No diplopia or unilateral vision loss   Respiratory: Positive for cough. Negative for chest tightness and shortness of breath.        Only rarely needs inhaler  Cardiovascular: Negative for chest pain,  palpitations and leg swelling.  Gastrointestinal: Negative for nausea, vomiting, abdominal pain, constipation and blood in stool.       No heartburn  Endocrine: Negative for polydipsia and polyuria.  Genitourinary: Negative for dysuria, hematuria and difficulty urinating.  Musculoskeletal: Positive for back pain and arthralgias. Negative for joint swelling.       Mild back and knee pain--aleve helps  Skin: Negative for rash.       Still with darkening on cheeks  Allergic/Immunologic: Negative for environmental allergies and immunocompromised state.  Neurological: Negative for syncope, weakness, light-headedness, numbness and headaches.  Hematological: Negative for adenopathy. Does not bruise/bleed easily.  Psychiatric/Behavioral: Negative for sleep disturbance and dysphoric mood. The  patient is not nervous/anxious.        Objective:   Physical Exam  Constitutional: She is oriented to person, place, and time. She appears well-developed and well-nourished. No distress.  HENT:  Head: Normocephalic and atraumatic.  Right Ear: External ear normal.  Left Ear: External ear normal.  Mouth/Throat: Oropharynx is clear and moist. No oropharyngeal exudate.  Eyes: Conjunctivae and EOM are normal. Pupils are equal, round, and reactive to light.  Neck: Normal range of motion. Neck supple. No thyromegaly present.  Cardiovascular: Normal rate, regular rhythm, normal heart sounds and intact distal pulses.  Exam reveals no gallop.   No murmur heard. Pulmonary/Chest: Effort normal and breath sounds normal. No respiratory distress. She has no wheezes. She has no rales.  Abdominal: Soft. There is no tenderness.  Musculoskeletal: She exhibits no edema or tenderness.  Lymphadenopathy:    She has no cervical adenopathy.  Neurological: She is alert and oriented to person, place, and time.  Skin: No rash noted. No erythema.  Psychiatric: She has a normal mood and affect. Her behavior is normal.          Assessment & Plan:

## 2014-08-05 NOTE — Assessment & Plan Note (Signed)
Healthy but needs to continue to work on fitness UTD with cancer screening--will keep up mammos every 2 years Colonoscopy at 22

## 2014-08-05 NOTE — Assessment & Plan Note (Signed)
BP Readings from Last 3 Encounters:  08/05/14 130/80  05/10/14 140/94  09/13/13 140/80   Better now Check labs

## 2014-09-15 ENCOUNTER — Other Ambulatory Visit: Payer: Self-pay | Admitting: *Deleted

## 2014-09-15 MED ORDER — AZITHROMYCIN 250 MG PO TABS: ORAL_TABLET | ORAL | Status: DC

## 2014-09-16 ENCOUNTER — Other Ambulatory Visit: Payer: Self-pay | Admitting: *Deleted

## 2014-09-16 MED ORDER — AZITHROMYCIN 250 MG PO TABS: ORAL_TABLET | ORAL | Status: DC

## 2014-09-30 ENCOUNTER — Ambulatory Visit (INDEPENDENT_AMBULATORY_CARE_PROVIDER_SITE_OTHER): Payer: Self-pay

## 2014-09-30 ENCOUNTER — Ambulatory Visit (INDEPENDENT_AMBULATORY_CARE_PROVIDER_SITE_OTHER): Payer: Self-pay | Admitting: Family Medicine

## 2014-09-30 VITALS — BP 130/80 | HR 90 | Temp 98.1°F | Resp 17 | Ht 66.0 in | Wt 302.0 lb

## 2014-09-30 DIAGNOSIS — M25561 Pain in right knee: Secondary | ICD-10-CM

## 2014-09-30 DIAGNOSIS — W19XXXA Unspecified fall, initial encounter: Secondary | ICD-10-CM

## 2014-09-30 DIAGNOSIS — S50312A Abrasion of left elbow, initial encounter: Secondary | ICD-10-CM

## 2014-09-30 DIAGNOSIS — S83421A Sprain of lateral collateral ligament of right knee, initial encounter: Secondary | ICD-10-CM

## 2014-09-30 NOTE — Progress Notes (Signed)
  Subjective:  Patient ID: Lindsey Walls, female    DOB: 05-05-1970  Age: 44 y.o. MRN: [id number]  Patient is here for a knee injury. She took out the trash from the office and slipped on blood, falling down with her right leg shooting out. She felt immediate pain in the right knee. Call back inside the building. She was brought here by her Glass blower/designer. The accident happened at work. She is a Radio broadcast assistant, primarily a desk job, working at a [employer].  She had hurt that leg in an accident last year, had to get physical therapy and be out of work for several months. Did not have to have any surgery on it.   Objective:   Obese lady, pleasant, alert and oriented. She has an abrasion on her left elbow with a little blood around it, but only a small tear of the skin. The right knee is tender, primarily on the lateral aspect of the knee and posterior lateral knee. No effusion. Stressing the joint there is pain laterally but no significant medial pain. Mild medial tenderness. No abrasions or discoloration. Able to move her foot well. It is too painful to tell for certain, but I believe the ligaments are intact.  [facility] reading (PRIMARY) by  Dr. Linna Darner  No fracture noted..    Assessment & Plan:   Assessment: Right knee pain and strain secondary to fall. Strain primarily laterally  Plan: Ice and rest and see how it does. Recheck her in a few days if it is continued to hurt a lot. Wear an immobilizer and use crutches.  Instructed patient she needs to talk with her management as to whether this should've been filed a Designer, jewellery which apparently was not yet.  Patient Instructions  Return midweek if not improved substantially (here or primary)  Ice 4-6 times daily for 10-20 minutes  Elevate leg as possible.  Take naproxen 500 mg twice daily  Use the crutches. May bear weight but avoid any twisting activities.  Return sooner if needed  No driving until it is safe to use the  knee without significant pain  Okay to work if you keep your leg elevated as much as the time as possible.     Otha Monical, MD 09/30/2014

## 2014-09-30 NOTE — Patient Instructions (Addendum)
Return midweek if not improved substantially (here or primary)  Ice 4-6 times daily for 10-20 minutes  Elevate leg as possible.  Take naproxen 500 mg twice daily  Use the crutches. May bear weight but avoid any twisting activities.  Return sooner if needed  No driving until it is safe to use the knee without significant pain  Okay to work if you keep your leg elevated as much as the time as possible.

## 2015-07-11 ENCOUNTER — Ambulatory Visit (INDEPENDENT_AMBULATORY_CARE_PROVIDER_SITE_OTHER): Payer: 59 | Admitting: Internal Medicine

## 2015-07-11 ENCOUNTER — Encounter: Payer: Self-pay | Admitting: Internal Medicine

## 2015-07-11 VITALS — BP 110/80 | HR 66 | Temp 98.6°F | Wt 314.0 lb

## 2015-07-11 DIAGNOSIS — R21 Rash and other nonspecific skin eruption: Secondary | ICD-10-CM | POA: Diagnosis not present

## 2015-07-11 DIAGNOSIS — I10 Essential (primary) hypertension: Secondary | ICD-10-CM

## 2015-07-11 DIAGNOSIS — J069 Acute upper respiratory infection, unspecified: Secondary | ICD-10-CM

## 2015-07-11 DIAGNOSIS — R7989 Other specified abnormal findings of blood chemistry: Secondary | ICD-10-CM | POA: Diagnosis not present

## 2015-07-11 DIAGNOSIS — R945 Abnormal results of liver function studies: Secondary | ICD-10-CM

## 2015-07-11 LAB — CBC WITH DIFFERENTIAL/PLATELET
BASOS ABS: 0 10*3/uL (ref 0.0–0.1)
BASOS PCT: 0.4 % (ref 0.0–3.0)
Eosinophils Absolute: 0.2 10*3/uL (ref 0.0–0.7)
Eosinophils Relative: 1.7 % (ref 0.0–5.0)
HEMATOCRIT: 38.5 % (ref 36.0–46.0)
Hemoglobin: 12.6 g/dL (ref 12.0–15.0)
LYMPHS ABS: 3.5 10*3/uL (ref 0.7–4.0)
LYMPHS PCT: 39.1 % (ref 12.0–46.0)
MCHC: 32.6 g/dL (ref 30.0–36.0)
MCV: 81.5 fl (ref 78.0–100.0)
MONOS PCT: 8.4 % (ref 3.0–12.0)
Monocytes Absolute: 0.7 10*3/uL (ref 0.1–1.0)
NEUTROS ABS: 4.5 10*3/uL (ref 1.4–7.7)
NEUTROS PCT: 50.4 % (ref 43.0–77.0)
PLATELETS: 317 10*3/uL (ref 150.0–400.0)
RBC: 4.72 Mil/uL (ref 3.87–5.11)
RDW: 13.8 % (ref 11.5–15.5)
WBC: 8.9 10*3/uL (ref 4.0–10.5)

## 2015-07-11 LAB — RENAL FUNCTION PANEL
Albumin: 3.7 g/dL (ref 3.5–5.2)
BUN: 13 mg/dL (ref 6–23)
CHLORIDE: 104 meq/L (ref 96–112)
CO2: 31 mEq/L (ref 19–32)
CREATININE: 0.72 mg/dL (ref 0.40–1.20)
Calcium: 9.3 mg/dL (ref 8.4–10.5)
GFR: 111.97 mL/min (ref >60.00)
GLUCOSE: 126 mg/dL — AB (ref 70–99)
PHOSPHORUS: 3.6 mg/dL (ref 2.3–4.6)
Potassium: 3.9 mEq/L (ref 3.5–5.1)
SODIUM: 142 meq/L (ref 135–145)

## 2015-07-11 LAB — HEPATIC FUNCTION PANEL
ALBUMIN: 3.7 g/dL (ref 3.5–5.2)
ALT: 64 U/L — AB (ref 0–35)
AST: 38 U/L — ABNORMAL HIGH (ref 0–37)
Alkaline Phosphatase: 54 U/L (ref 39–117)
BILIRUBIN DIRECT: 0.1 mg/dL (ref 0.0–0.3)
TOTAL PROTEIN: 7.1 g/dL (ref 6.0–8.3)
Total Bilirubin: 0.4 mg/dL (ref 0.2–1.2)

## 2015-07-11 LAB — LIPID PANEL
CHOLESTEROL: 220 mg/dL — AB (ref 0–200)
HDL: 45.2 mg/dL (ref >39.00)
LDL CALC: 156 mg/dL — AB (ref 0–99)
NonHDL: 175.08
TRIGLYCERIDES: 95 mg/dL (ref 0.0–149.0)
Total CHOL/HDL Ratio: 5
VLDL: 19 mg/dL (ref 0.0–40.0)

## 2015-07-11 LAB — T4, FREE: Free T4: 0.73 ng/dL (ref 0.60–1.60)

## 2015-07-11 MED ORDER — NAPROXEN 500 MG PO TABS: 500.0000 mg | ORAL_TABLET | Freq: Two times a day (BID) | ORAL | Status: DC | PRN

## 2015-07-11 NOTE — Assessment & Plan Note (Signed)
Looks just hyperpigmented ?stasis changes No signs of infection Will monitor and keep moisturized

## 2015-07-11 NOTE — Progress Notes (Signed)
Pre visit review using our clinic review tool, if applicable. No additional management support is needed unless otherwise documented below in the visit note. 

## 2015-07-11 NOTE — Assessment & Plan Note (Signed)
Discussed self limited nature and symptom Rx If worsens with productive cough, etc--- after 7-10 days of illness, would consider empiric antibiotic

## 2015-07-11 NOTE — Assessment & Plan Note (Signed)
Noted last year Will recheck and hepatitis profile ?steatosis Will consider ultrasound

## 2015-07-11 NOTE — Progress Notes (Signed)
Subjective:    Patient ID: Lindsey Walls, female    DOB: 08-02-1970, 45 y.o.   MRN: [id number]  HPI Here due to rash and respiratory illness  Has spot on right ankle Very itchy at first--but this is better No pain or irritation No known bite or exposure  Started with cold symptoms ~5 days ago Cough and rhinorrhea. Cough is dry Headache and congestion Some swollen glands in neck Trouble breathing at night when lying down---inhaler helps (needs to breathe through mouth) No fever No sweats or chills Initial sore throat--now better No ear pain  Alka seltzer plus helps with the runny nose  Current Outpatient Prescriptions on File Prior to Visit  Medication Sig Dispense Refill  . lisinopril-hydrochlorothiazide (PRINZIDE,ZESTORETIC) 20-25 MG per tablet Take 1 tablet by mouth daily. 90 tablet 3  . naproxen (NAPROSYN) 500 MG tablet Take 1 tablet (500 mg total) by mouth 2 (two) times daily with a meal. 60 tablet 5  . PROAIR HFA 108 (90 BASE) MCG/ACT inhaler inhale 2 puffs by mouth every 6 hours if needed 18 g 1   No current facility-administered medications on file prior to visit.    No Known Allergies  Past Medical History  Diagnosis Date  . Hypertension   . Hyperlipidemia   . Obesity   . Chronic back pain   . Obesity   . Bronchitis, chronic ([place])   . Anemia   . Fibroid     Past Surgical History  Procedure Laterality Date  . Cesarean section    . Tibia fracture surgery    . Cholecystectomy    . Endometrial ablation  08/08  . Robotic assisted total hysterectomy N/A 11/23/2012    Procedure: ATTEMPTED ROBOTIC ASSISTED TOTAL HYSTERECTOMY;  Surgeon: Daria Pastures, MD;  Location: [facility];  Service: Gynecology;  Laterality: N/A;  . Abdominal hysterectomy N/A 11/23/2012    Procedure: HYSTERECTOMY ABDOMINAL with repair of laproscopic torcar incision; lysis of adhesions ;  Surgeon: Daria Pastures, MD;  Location: [facility];  Service: Gynecology;  Laterality: N/A;  .  Cystoscopy N/A 11/23/2012    Procedure: CYSTOSCOPY;  Surgeon: Daria Pastures, MD;  Location: [facility];  Service: Gynecology;  Laterality: N/A;  . Omentectomy N/A 11/23/2012    Procedure: OMENTECTOMY;  Surgeon: Daria Pastures, MD;  Location: [facility];  Service: Gynecology;  Laterality: N/A;    Family History  Problem Relation Age of Onset  . Coronary artery disease Mother   . Hypertension Father   . Cancer Father     lung  . Coronary artery disease Sister     Social History   Social History  . Marital Status: Married    Spouse Name: N/A  . Number of Children: 2  . Years of Education: N/A   Occupational History  . 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 Smoking status: Never Smoker   . Smokeless tobacco: Never Used  . Alcohol Use: No  . Drug Use: No  . Sexual Activity: Not on file   Other Topics Concern  . Not on file   Social History Narrative   Review of Systems No vomiting or diarrhea Appetite is good    Objective:   Physical Exam  Constitutional: She appears well-developed and well-nourished. No distress.  HENT:  Slight right maxillary tenderness Moderate nasal inflammation TMs normal Pharynx is mildly red without exudate  Neck: Normal range of motion. Neck supple. No thyromegaly present.  Pulmonary/Chest: Effort normal  and breath sounds normal. No respiratory distress. She has no wheezes. She has no rales.  Lymphadenopathy:    She has no cervical adenopathy.  Skin:  Slight oval shaped area of hyperpigmentation on medial right ankle. No ulcer or inflammation          Assessment & Plan:

## 2015-07-11 NOTE — Assessment & Plan Note (Signed)
BP Readings from Last 3 Encounters:  07/11/15 110/80  09/30/14 130/80  08/05/14 130/80   Still has good control

## 2015-07-12 LAB — HEPATITIS PANEL, ACUTE
HCV AB: NEGATIVE
HEP A IGM: NONREACTIVE
HEP B C IGM: NONREACTIVE
HEP B S AG: NEGATIVE

## 2015-08-11 ENCOUNTER — Encounter: Payer: 59 | Admitting: Internal Medicine

## 2015-08-11 ENCOUNTER — Telehealth: Payer: Self-pay

## 2015-08-11 ENCOUNTER — Encounter: Payer: Self-pay | Admitting: Internal Medicine

## 2015-08-11 NOTE — Telephone Encounter (Signed)
PLEASE NOTE: All timestamps contained within this report are represented as [place] Standard Time. CONFIDENTIALTY NOTICE: This fax transmission is intended only for the addressee. It contains information that is legally privileged, confidential or otherwise protected from use or disclosure. If you are not the intended recipient, you are strictly prohibited from reviewing, disclosing, copying using or disseminating any of this information or taking any action in reliance on or regarding this information. If you have received this fax in error, please notify us immediately by telephone so that we can arrange for its return to [place]. Phone: [phone], Toll-Free: [phone], Fax: [phone] Page: 1 of 1 Call Id: [id number] [facility] - Client Nonclinical Telephone Record [facility] - Client Client Site [place] - Day Physician Viviana Simpler - MD Contact Type Call Who Is Calling Patient / Member / Family / Caregiver Caller Name Wayne Phone Number [phone] Patient Name Lindsey Walls Call Type Message Only Information Provided Reason for Call Request to [facility] Appointment Initial Comment Caller states that she has an appt tomorrow morning and needs cancel this appt Call Closed By: Starleen Arms Transaction Date/Time: 08/10/2015 5:02:32 PM (ET)

## 2015-08-11 NOTE — Telephone Encounter (Signed)
Pt already called and rescheduled CPX appt for 11/27/15.

## 2015-08-21 ENCOUNTER — Other Ambulatory Visit: Payer: Self-pay

## 2015-08-21 MED ORDER — LISINOPRIL-HYDROCHLOROTHIAZIDE 20-25 MG PO TABS: 1.0000 | ORAL_TABLET | Freq: Every day | ORAL | Status: DC

## 2015-08-21 NOTE — Telephone Encounter (Signed)
Rx sent electronically.  

## 2015-11-27 ENCOUNTER — Encounter: Payer: 59 | Admitting: Internal Medicine

## 2015-11-27 DIAGNOSIS — Z0289 Encounter for other administrative examinations: Secondary | ICD-10-CM

## 2015-11-28 ENCOUNTER — Telehealth: Payer: Self-pay | Admitting: Internal Medicine

## 2015-11-28 NOTE — Telephone Encounter (Signed)
Patient did not come in for their appointment 11/27/15 for cpe.  Please let me know if patient needs to be contacted immediately for follow up or no follow up needed.

## 2015-11-28 NOTE — Telephone Encounter (Signed)
Please have her reschedule at her earliest convenience

## 2015-12-01 NOTE — Telephone Encounter (Signed)
Pt wants to r/s and will call back to do so

## 2016-01-23 ENCOUNTER — Encounter: Payer: Self-pay | Admitting: Internal Medicine

## 2016-01-23 ENCOUNTER — Ambulatory Visit (INDEPENDENT_AMBULATORY_CARE_PROVIDER_SITE_OTHER): Payer: 59 | Admitting: Internal Medicine

## 2016-01-23 VITALS — BP 108/74 | HR 60 | Temp 98.2°F | Wt 305.0 lb

## 2016-01-23 DIAGNOSIS — R350 Frequency of micturition: Secondary | ICD-10-CM

## 2016-01-23 DIAGNOSIS — Z113 Encounter for screening for infections with a predominantly sexual mode of transmission: Secondary | ICD-10-CM

## 2016-01-23 LAB — POC URINALSYSI DIPSTICK (AUTOMATED)
Bilirubin, UA: NEGATIVE
GLUCOSE UA: NEGATIVE
Ketones, UA: NEGATIVE
Nitrite, UA: NEGATIVE
Protein, UA: NEGATIVE
UROBILINOGEN UA: 0.2
pH, UA: 6

## 2016-01-23 MED ORDER — ALBUTEROL SULFATE HFA 108 (90 BASE) MCG/ACT IN AERS: INHALATION_SPRAY | RESPIRATORY_TRACT | 1 refills | Status: DC

## 2016-01-23 MED ORDER — SULFAMETHOXAZOLE-TRIMETHOPRIM 800-160 MG PO TABS: 1.0000 | ORAL_TABLET | Freq: Two times a day (BID) | ORAL | 1 refills | Status: DC

## 2016-01-23 NOTE — Addendum Note (Signed)
Addended by: Ellamae Sia on: 01/23/2016 12:01 PM   Modules accepted: Orders

## 2016-01-23 NOTE — Assessment & Plan Note (Signed)
Urinalysis positive for leukocytes and blood History consistent with uncomplicated cystitis Will do STD testing and discussed condoms

## 2016-01-23 NOTE — Progress Notes (Signed)
   Subjective:    Patient ID: Lindsey Walls, female    DOB: 08-26-1970, 45 y.o.   MRN: [id number]  HPI Here due to urinary symptoms Started yesterday--- frequency Went 5 times today and saw some blood on toilet paper when wiping once (very light pink)  No vaginal discharge No pain No fever No abdominal pain  New sexual partner for past month Very happy with him--minister No condoms  Current Outpatient Prescriptions on File Prior to Visit  Medication Sig Dispense Refill  . lisinopril-hydrochlorothiazide (PRINZIDE,ZESTORETIC) 20-25 MG tablet Take 1 tablet by mouth daily. 90 tablet 1  . naproxen (NAPROSYN) 500 MG tablet Take 1 tablet (500 mg total) by mouth 2 (two) times daily as needed. 60 tablet 11  . PROAIR HFA 108 (90 BASE) MCG/ACT inhaler inhale 2 puffs by mouth every 6 hours if needed 18 g 1   No current facility-administered medications on file prior to visit.     No Known Allergies  Past Medical History:  Diagnosis Date  . Anemia   . Bronchitis, chronic ([place])   . Chronic back pain   . Fibroid   . Hyperlipidemia   . Hypertension   . Obesity   . Obesity     Past Surgical History:  Procedure Laterality Date  . ABDOMINAL HYSTERECTOMY N/A 11/23/2012   Procedure: HYSTERECTOMY ABDOMINAL with repair of laproscopic torcar incision; lysis of adhesions ;  Surgeon: Daria Pastures, MD;  Location: [facility];  Service: Gynecology;  Laterality: N/A;  . CESAREAN SECTION    . CHOLECYSTECTOMY    . CYSTOSCOPY N/A 11/23/2012   Procedure: CYSTOSCOPY;  Surgeon: Daria Pastures, MD;  Location: [facility];  Service: Gynecology;  Laterality: N/A;  . ENDOMETRIAL ABLATION  08/08  . OMENTECTOMY N/A 11/23/2012   Procedure: OMENTECTOMY;  Surgeon: Daria Pastures, MD;  Location: [facility];  Service: Gynecology;  Laterality: N/A;  . ROBOTIC ASSISTED TOTAL HYSTERECTOMY N/A 11/23/2012   Procedure: ATTEMPTED ROBOTIC ASSISTED TOTAL HYSTERECTOMY;  Surgeon: Daria Pastures, MD;  Location: [facility];   Service: Gynecology;  Laterality: N/A;  . TIBIA FRACTURE SURGERY      Family History  Problem Relation Age of Onset  . Coronary artery disease Mother   . Hypertension Father   . Cancer Father     lung  . Coronary artery disease Sister     Social History   Social History  . Marital status: Married    Spouse name: N/A  . Number of children: 2  . Years of education: N/A   Occupational History  . 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 Smoking status: Never Smoker  . Smokeless tobacco: Never Used  . Alcohol use No  . Drug use: No  . Sexual activity: Not on file   Other Topics Concern  . Not on file   Social History Narrative  . No narrative on file   Review of Systems Appetite off --relates to different stressors No nausea or vomiting  No back pain    Objective:   Physical Exam  Constitutional: She appears well-developed and well-nourished. No distress.  Abdominal: Soft. She exhibits no distension. There is no tenderness. There is no rebound and no guarding.  Musculoskeletal:  No CVA tenderness          Assessment & Plan:

## 2016-01-23 NOTE — Progress Notes (Signed)
Pre visit review using our clinic review tool, if applicable. No additional management support is needed unless otherwise documented below in the visit note. 

## 2016-01-23 NOTE — Addendum Note (Signed)
Addended by: Pilar Grammes on: 01/23/2016 12:45 PM   Modules accepted: Orders

## 2016-01-24 LAB — GC/CHLAMYDIA PROBE AMP
CT Probe RNA: NOT DETECTED
GC PROBE AMP APTIMA: NOT DETECTED

## 2016-01-24 LAB — HIV ANTIBODY (ROUTINE TESTING W REFLEX): HIV 1&2 Ab, 4th Generation: NONREACTIVE

## 2016-01-24 LAB — RPR

## 2016-02-18 ENCOUNTER — Other Ambulatory Visit: Payer: Self-pay | Admitting: Internal Medicine

## 2016-04-09 ENCOUNTER — Encounter: Payer: Self-pay | Admitting: Internal Medicine

## 2016-04-09 ENCOUNTER — Ambulatory Visit (INDEPENDENT_AMBULATORY_CARE_PROVIDER_SITE_OTHER): Payer: 59 | Admitting: Internal Medicine

## 2016-04-09 VITALS — BP 120/78 | HR 82 | Temp 99.3°F | Wt 311.0 lb

## 2016-04-09 DIAGNOSIS — J209 Acute bronchitis, unspecified: Secondary | ICD-10-CM

## 2016-04-09 MED ORDER — PREDNISONE 10 MG PO TABS: ORAL_TABLET | ORAL | 0 refills | Status: DC

## 2016-04-09 MED ORDER — HYDROCODONE-HOMATROPINE 5-1.5 MG/5ML PO SYRP: 5.0000 mL | ORAL_SOLUTION | Freq: Three times a day (TID) | ORAL | 0 refills | Status: DC | PRN

## 2016-04-09 NOTE — Progress Notes (Signed)
HPI  Pt presents to the clinic today with c/o runny nose, sore throat, cough and wheezing. This started 4 days ago. She is blowing clear mucous out of her nose. She denies difficulty swallowing. The cough is non productive. She denies fever, chills or body aches. She has tried Copywriter, advertising and Albuterol with minimal relief. She has a history of chronic bronchitis. She has not had sick contacts that she is aware of. She does not take flu shots.  Review of Systems        Past Medical History:  Diagnosis Date  . Anemia   . Bronchitis, chronic ([place])   . Chronic back pain   . Fibroid   . Hyperlipidemia   . Hypertension   . Obesity   . Obesity     Family History  Problem Relation Age of Onset  . Coronary artery disease Mother   . Hypertension Father   . Cancer Father     lung  . Coronary artery disease Sister     Social History   Social History  . Marital status: Married    Spouse name: N/A  . Number of children: 2  . Years of education: N/A   Occupational History  . 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 Smoking status: Never Smoker  . Smokeless tobacco: Never Used  . Alcohol use No  . Drug use: No  . Sexual activity: Not on file   Other Topics Concern  . Not on file   Social History Narrative  . No narrative on file    No Known Allergies   Constitutional: Denies headache, fatigue, fever or abrupt weight changes.  HEENT:  Positive runny nose, sore throat. Denies eye redness, eye pain, pressure behind the eyes, facial pain, nasal congestion, ear pain, ringing in the ears, wax buildup, or bloody nose. Respiratory: Positive cough. Denies difficulty breathing or shortness of breath.  Cardiovascular: Denies chest pain, chest tightness, palpitations or swelling in the hands or feet.   No other specific complaints in a complete review of systems (except as listed in HPI above).  Objective:   BP 120/78   Pulse 82   Temp 99.3 F (37.4  C) (Oral)   Wt (!) 311 lb (141.1 kg)   LMP 10/12/2012   SpO2 98%   BMI 50.20 kg/m  Wt Readings from Last 3 Encounters:  04/09/16 (!) 311 lb (141.1 kg)  01/23/16 (!) 305 lb (138.3 kg)  07/11/15 (!) 314 lb (142.4 kg)     General: Appears her stated age, obese in NAD. HEENT: Head: normal shape and size, no sinus tenderness noted; Eyes: sclera white, no icterus, conjunctiva pink; Ears: Tm's gray and intact, normal light reflex; Nose: mucosa pink and moist, septum midline; Throat/Mouth: Teeth present, mucosa pink and moist, no exudate noted, no lesions or ulcerations noted.  Neck: No cervical lymphadenopathy.  Cardiovascular: Normal rate and rhythm. S1,S2 noted.  Pulmonary/Chest: Normal effort and bilateral expiratory wheezing noted. No respiratory distress. No rales or ronchi noted.       Assessment & Plan:   Acute Bronchitis:  Get some rest and drink plenty of water Do salt water gargles for the sore throat eRx for Pred Taper x 6 days Rx for Hycodan cough syrup  RTC as needed or if symptoms persist.   Webb Silversmith, NP

## 2016-04-09 NOTE — Patient Instructions (Signed)

## 2016-04-10 ENCOUNTER — Ambulatory Visit: Payer: 59 | Admitting: Internal Medicine

## 2016-07-12 ENCOUNTER — Other Ambulatory Visit: Payer: Self-pay | Admitting: Internal Medicine

## 2016-09-02 ENCOUNTER — Other Ambulatory Visit: Payer: Self-pay | Admitting: Internal Medicine

## 2016-10-21 ENCOUNTER — Other Ambulatory Visit: Payer: Self-pay | Admitting: Internal Medicine

## 2016-11-18 ENCOUNTER — Telehealth: Payer: Self-pay

## 2016-11-18 MED ORDER — LISINOPRIL-HYDROCHLOROTHIAZIDE 20-25 MG PO TABS: 1.0000 | ORAL_TABLET | Freq: Every day | ORAL | 0 refills | Status: DC

## 2016-11-18 NOTE — Telephone Encounter (Signed)
Needs refill of lisinopril-hctz sent to pharmacy to last until her appt with Dr Silvio Pate.

## 2016-11-18 NOTE — Telephone Encounter (Signed)
Rx sent electronically.  

## 2016-12-04 ENCOUNTER — Encounter: Payer: Self-pay | Admitting: Internal Medicine

## 2016-12-04 ENCOUNTER — Ambulatory Visit (INDEPENDENT_AMBULATORY_CARE_PROVIDER_SITE_OTHER): Payer: 59 | Admitting: Internal Medicine

## 2016-12-04 VITALS — BP 124/78 | HR 77 | Temp 98.2°F | Ht 65.75 in | Wt 303.0 lb

## 2016-12-04 DIAGNOSIS — Z0000 Encounter for general adult medical examination without abnormal findings: Secondary | ICD-10-CM | POA: Diagnosis not present

## 2016-12-04 DIAGNOSIS — I10 Essential (primary) hypertension: Secondary | ICD-10-CM

## 2016-12-04 DIAGNOSIS — R7309 Other abnormal glucose: Secondary | ICD-10-CM | POA: Diagnosis not present

## 2016-12-04 DIAGNOSIS — E785 Hyperlipidemia, unspecified: Secondary | ICD-10-CM

## 2016-12-04 DIAGNOSIS — J4520 Mild intermittent asthma, uncomplicated: Secondary | ICD-10-CM

## 2016-12-04 LAB — COMPREHENSIVE METABOLIC PANEL
ALBUMIN: 4 g/dL (ref 3.5–5.2)
ALK PHOS: 44 U/L (ref 39–117)
ALT: 39 U/L — AB (ref 0–35)
AST: 27 U/L (ref 0–37)
BILIRUBIN TOTAL: 0.5 mg/dL (ref 0.2–1.2)
BUN: 13 mg/dL (ref 6–23)
CALCIUM: 10 mg/dL (ref 8.4–10.5)
CO2: 33 meq/L — AB (ref 19–32)
CREATININE: 0.79 mg/dL (ref 0.40–1.20)
Chloride: 105 mEq/L (ref 96–112)
GFR: 100 mL/min (ref >60.00)
Glucose, Bld: 117 mg/dL — ABNORMAL HIGH (ref 70–99)
Potassium: 4.4 mEq/L (ref 3.5–5.1)
Sodium: 143 mEq/L (ref 135–145)
TOTAL PROTEIN: 7.4 g/dL (ref 6.0–8.3)

## 2016-12-04 LAB — CBC WITH DIFFERENTIAL/PLATELET
BASOS ABS: 0 10*3/uL (ref 0.0–0.1)
Basophils Relative: 0.3 % (ref 0.0–3.0)
EOS ABS: 0.1 10*3/uL (ref 0.0–0.7)
Eosinophils Relative: 1.5 % (ref 0.0–5.0)
HEMATOCRIT: 39.7 % (ref 36.0–46.0)
HEMOGLOBIN: 12.7 g/dL (ref 12.0–15.0)
LYMPHS PCT: 42.5 % (ref 12.0–46.0)
Lymphs Abs: 3.7 10*3/uL (ref 0.7–4.0)
MCHC: 32.1 g/dL (ref 30.0–36.0)
MCV: 83.8 fl (ref 78.0–100.0)
MONOS PCT: 6.8 % (ref 3.0–12.0)
Monocytes Absolute: 0.6 10*3/uL (ref 0.1–1.0)
Neutro Abs: 4.2 10*3/uL (ref 1.4–7.7)
Neutrophils Relative %: 48.9 % (ref 43.0–77.0)
PLATELETS: 334 10*3/uL (ref 150.0–400.0)
RBC: 4.74 Mil/uL (ref 3.87–5.11)
RDW: 13.8 % (ref 11.5–15.5)
WBC: 8.7 10*3/uL (ref 4.0–10.5)

## 2016-12-04 LAB — LIPID PANEL
CHOL/HDL RATIO: 5
Cholesterol: 240 mg/dL — ABNORMAL HIGH (ref 0–200)
HDL: 45.8 mg/dL (ref >39.00)
LDL CALC: 173 mg/dL — AB (ref 0–99)
NONHDL: 194.44
TRIGLYCERIDES: 109 mg/dL (ref 0.0–149.0)
VLDL: 21.8 mg/dL (ref 0.0–40.0)

## 2016-12-04 LAB — HEMOGLOBIN A1C: HEMOGLOBIN A1C: 6.5 % (ref 4.6–6.5)

## 2016-12-04 NOTE — Assessment & Plan Note (Signed)
Strong FH but she prefers no statin for now

## 2016-12-04 NOTE — Progress Notes (Signed)
Subjective:    Patient ID: Lindsey Walls Seen, female    DOB: 19-Oct-1970, 46 y.o.   MRN: [id number]  HPI Here for physical  Empty nester now Son is senior at [employer] is starting at [employer] No man right now  Has lump on posterior left calf--below knee ~80mm fleshy mole---reassured  Uses inhaler only once in a while Usually gets bronchitis in winter and fall changes No allergy problems  Trying to walk more No set eating plan--cut out tea and cutting down on carbs  Current Outpatient Prescriptions on File Prior to Visit  Medication Sig Dispense Refill  . albuterol (PROAIR HFA) 108 (90 Base) MCG/ACT inhaler inhale 2 puffs by mouth every 6 hours if needed 18 g 1  . lisinopril-hydrochlorothiazide (PRINZIDE,ZESTORETIC) 20-25 MG tablet Take 1 tablet by mouth daily. 30 tablet 0  . naproxen (NAPROSYN) 500 MG tablet take 1 tablet by mouth twice a day if needed 60 tablet 0   No current facility-administered medications on file prior to visit.     No Known Allergies  Past Medical History:  Diagnosis Date  . Anemia   . Bronchitis, chronic ([place])   . Chronic back pain   . Fibroid   . Hyperlipidemia   . Hypertension   . Obesity   . Obesity     Past Surgical History:  Procedure Laterality Date  . ABDOMINAL HYSTERECTOMY N/A 11/23/2012   Procedure: HYSTERECTOMY ABDOMINAL with repair of laproscopic torcar incision; lysis of adhesions ;  Surgeon: Daria Pastures, MD;  Location: [facility];  Service: Gynecology;  Laterality: N/A;  . CESAREAN SECTION    . CHOLECYSTECTOMY    . CYSTOSCOPY N/A 11/23/2012   Procedure: CYSTOSCOPY;  Surgeon: Daria Pastures, MD;  Location: [facility];  Service: Gynecology;  Laterality: N/A;  . ENDOMETRIAL ABLATION  08/08  . OMENTECTOMY N/A 11/23/2012   Procedure: OMENTECTOMY;  Surgeon: Daria Pastures, MD;  Location: [facility];  Service: Gynecology;  Laterality: N/A;  . ROBOTIC ASSISTED TOTAL HYSTERECTOMY N/A 11/23/2012   Procedure: ATTEMPTED ROBOTIC  ASSISTED TOTAL HYSTERECTOMY;  Surgeon: Daria Pastures, MD;  Location: [facility];  Service: Gynecology;  Laterality: N/A;  . TIBIA FRACTURE SURGERY      Family History  Problem Relation Age of Onset  . Coronary artery disease Mother   . Hypertension Father   . Cancer Father        lung  . Coronary artery disease Sister     Social History   Social History  . Marital status: Married    Spouse name: N/A  . Number of children: 2  . Years of education: N/A   Occupational History  . 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 Smoking status: Never Smoker  . Smokeless tobacco: Never Used  . Alcohol use No  . Drug use: No  . Sexual activity: Not on file   Other Topics Concern  . Not on file   Social History Narrative  . No narrative on file   Review of Systems  Constitutional: Negative for fatigue and unexpected weight change.       Wears seat belt  HENT: Negative for dental problem, hearing loss and tinnitus.        Overdue for dentist--- financial  Eyes: Negative for visual disturbance.       No diplopia or unilateral vision loss  Respiratory: Negative for cough, chest tightness and shortness of breath.   Cardiovascular: Positive for leg swelling. Negative  for chest pain and palpitations.       End of day left ankle swelling---gone after elevation Discussed support hose  Gastrointestinal: Negative for abdominal pain, blood in stool, constipation and nausea.  Endocrine: Negative for polydipsia and polyuria.  Genitourinary: Negative for dyspareunia, dysuria and hematuria.  Musculoskeletal: Positive for back pain. Negative for joint swelling.       Uses aleve 1-2 times per week for mild back pain  Skin: Negative for rash.  Allergic/Immunologic: Negative for environmental allergies and immunocompromised state.  Neurological: Negative for dizziness, syncope, light-headedness and headaches.  Hematological: Negative for adenopathy. Bruises/bleeds  easily.  Psychiatric/Behavioral: Negative for dysphoric mood and sleep disturbance. The patient is not nervous/anxious.        Objective:   Physical Exam  Constitutional: She is oriented to person, place, and time. She appears well-nourished. No distress.  HENT:  Head: Normocephalic and atraumatic.  Right Ear: External ear normal.  Left Ear: External ear normal.  Mouth/Throat: Oropharynx is clear and moist. No oropharyngeal exudate.  Eyes: Pupils are equal, round, and reactive to light. Conjunctivae are normal.  Neck: Normal range of motion. Neck supple. No thyromegaly present.  Cardiovascular: Normal rate, regular rhythm, normal heart sounds and intact distal pulses.  Exam reveals no gallop.   No murmur heard. Pulmonary/Chest: Effort normal and breath sounds normal. No respiratory distress. She has no wheezes. She has no rales.  Abdominal: Soft. There is no tenderness.  Musculoskeletal: She exhibits no edema or tenderness.  Lymphadenopathy:    She has no cervical adenopathy.  Neurological: She is alert and oriented to person, place, and time.  Skin: No rash noted. No erythema.  Psychiatric: She has a normal mood and affect. Her behavior is normal.          Assessment & Plan:

## 2016-12-04 NOTE — Assessment & Plan Note (Signed)
Will check A1c

## 2016-12-04 NOTE — Patient Instructions (Signed)
DASH Eating Plan DASH stands for "Dietary Approaches to Stop Hypertension." The DASH eating plan is a healthy eating plan that has been shown to reduce high blood pressure (hypertension). It may also reduce your risk for type 2 diabetes, heart disease, and stroke. The DASH eating plan may also help with weight loss. What are tips for following this plan? General guidelines  Avoid eating more than 2,300 mg (milligrams) of salt (sodium) a day. If you have hypertension, you may need to reduce your sodium intake to 1,500 mg a day.  Limit alcohol intake to no more than 1 drink a day for nonpregnant women and 2 drinks a day for men. One drink equals 12 oz of beer, 5 oz of wine, or 1 oz of hard liquor.  Work with your health care provider to maintain a healthy body weight or to lose weight. Ask what an ideal weight is for you.  Get at least 30 minutes of exercise that causes your heart to beat faster (aerobic exercise) most days of the week. Activities may include walking, swimming, or biking.  Work with your health care provider or diet and nutrition specialist (dietitian) to adjust your eating plan to your individual calorie needs. Reading food labels  Check food labels for the amount of sodium per serving. Choose foods with less than 5 percent of the Daily Value of sodium. Generally, foods with less than 300 mg of sodium per serving fit into this eating plan.  To find whole grains, look for the word "whole" as the first word in the ingredient list. Shopping  Buy products labeled as "low-sodium" or "no salt added."  Buy fresh foods. Avoid canned foods and premade or frozen meals. Cooking  Avoid adding salt when cooking. Use salt-free seasonings or herbs instead of table salt or sea salt. Check with your health care provider or pharmacist before using salt substitutes.  Do not fry foods. Cook foods using healthy methods such as baking, boiling, grilling, and broiling instead.  Cook with  heart-healthy oils, such as olive, canola, soybean, or sunflower oil. Meal planning   Eat a balanced diet that includes: ? 5 or more servings of fruits and vegetables each day. At each meal, try to fill half of your plate with fruits and vegetables. ? Up to 6-8 servings of whole grains each day. ? Less than 6 oz of lean meat, poultry, or fish each day. A 3-oz serving of meat is about the same size as a deck of cards. One egg equals 1 oz. ? 2 servings of low-fat dairy each day. ? A serving of nuts, seeds, or beans 5 times each week. ? Heart-healthy fats. Healthy fats called Omega-3 fatty acids are found in foods such as flaxseeds and coldwater fish, like sardines, salmon, and mackerel.  Limit how much you eat of the following: ? Canned or prepackaged foods. ? Food that is high in trans fat, such as fried foods. ? Food that is high in saturated fat, such as fatty meat. ? Sweets, desserts, sugary drinks, and other foods with added sugar. ? Full-fat dairy products.  Do not salt foods before eating.  Try to eat at least 2 vegetarian meals each week.  Eat more home-cooked food and less restaurant, buffet, and fast food.  When eating at a restaurant, ask that your food be prepared with less salt or no salt, if possible. What foods are recommended? The items listed may not be a complete list. Talk with your dietitian about what   dietary choices are best for you. Grains Whole-grain or whole-wheat bread. Whole-grain or whole-wheat pasta. Brown rice. Oatmeal. Quinoa. Bulgur. Whole-grain and low-sodium cereals. Pita bread. Low-fat, low-sodium crackers. Whole-wheat flour tortillas. Vegetables Fresh or frozen vegetables (raw, steamed, roasted, or grilled). Low-sodium or reduced-sodium tomato and vegetable juice. Low-sodium or reduced-sodium tomato sauce and tomato paste. Low-sodium or reduced-sodium canned vegetables. Fruits All fresh, dried, or frozen fruit. Canned fruit in natural juice (without  added sugar). Meat and other protein foods Skinless chicken or turkey. Ground chicken or turkey. Pork with fat trimmed off. Fish and seafood. Egg whites. Dried beans, peas, or lentils. Unsalted nuts, nut butters, and seeds. Unsalted canned beans. Lean cuts of beef with fat trimmed off. Low-sodium, lean deli meat. Dairy Low-fat (1%) or fat-free (skim) milk. Fat-free, low-fat, or reduced-fat cheeses. Nonfat, low-sodium ricotta or cottage cheese. Low-fat or nonfat yogurt. Low-fat, low-sodium cheese. Fats and oils Soft margarine without trans fats. Vegetable oil. Low-fat, reduced-fat, or light mayonnaise and salad dressings (reduced-sodium). Canola, safflower, olive, soybean, and sunflower oils. Avocado. Seasoning and other foods Herbs. Spices. Seasoning mixes without salt. Unsalted popcorn and pretzels. Fat-free sweets. What foods are not recommended? The items listed may not be a complete list. Talk with your dietitian about what dietary choices are best for you. Grains Baked goods made with fat, such as croissants, muffins, or some breads. Dry pasta or rice meal packs. Vegetables Creamed or fried vegetables. Vegetables in a cheese sauce. Regular canned vegetables (not low-sodium or reduced-sodium). Regular canned tomato sauce and paste (not low-sodium or reduced-sodium). Regular tomato and vegetable juice (not low-sodium or reduced-sodium). Pickles. Olives. Fruits Canned fruit in a light or heavy syrup. Fried fruit. Fruit in cream or butter sauce. Meat and other protein foods Fatty cuts of meat. Ribs. Fried meat. Bacon. Sausage. Bologna and other processed lunch meats. Salami. Fatback. Hotdogs. Bratwurst. Salted nuts and seeds. Canned beans with added salt. Canned or smoked fish. Whole eggs or egg yolks. Chicken or turkey with skin. Dairy Whole or 2% milk, cream, and half-and-half. Whole or full-fat cream cheese. Whole-fat or sweetened yogurt. Full-fat cheese. Nondairy creamers. Whipped toppings.  Processed cheese and cheese spreads. Fats and oils Butter. Stick margarine. Lard. Shortening. Ghee. Bacon fat. Tropical oils, such as coconut, palm kernel, or palm oil. Seasoning and other foods Salted popcorn and pretzels. Onion salt, garlic salt, seasoned salt, table salt, and sea salt. Worcestershire sauce. Tartar sauce. Barbecue sauce. Teriyaki sauce. Soy sauce, including reduced-sodium. Steak sauce. Canned and packaged gravies. Fish sauce. Oyster sauce. Cocktail sauce. Horseradish that you find on the shelf. Ketchup. Mustard. Meat flavorings and tenderizers. Bouillon cubes. Hot sauce and Tabasco sauce. Premade or packaged marinades. Premade or packaged taco seasonings. Relishes. Regular salad dressings. Where to find more information:  National Heart, Lung, and Blood Institute: www.nhlbi.nih.gov  American Heart Association: www.heart.org Summary  The DASH eating plan is a healthy eating plan that has been shown to reduce high blood pressure (hypertension). It may also reduce your risk for type 2 diabetes, heart disease, and stroke.  With the DASH eating plan, you should limit salt (sodium) intake to 2,300 mg a day. If you have hypertension, you may need to reduce your sodium intake to 1,500 mg a day.  When on the DASH eating plan, aim to eat more fresh fruits and vegetables, whole grains, lean proteins, low-fat dairy, and heart-healthy fats.  Work with your health care provider or diet and nutrition specialist (dietitian) to adjust your eating plan to your individual   calorie needs. This information is not intended to replace advice given to you by your health care provider. Make sure you discuss any questions you have with your health care provider. Document Released: 03/14/2011 Document Revised: 03/18/2016 Document Reviewed: 03/18/2016 Elsevier Interactive Patient Education  2017 Elsevier Inc.  

## 2016-12-04 NOTE — Assessment & Plan Note (Signed)
Healthy but needs to work on lifestyle Discussed walking and weights DASH She will set up mammo No pap due to hyster Colon screening at 48

## 2016-12-04 NOTE — Assessment & Plan Note (Signed)
BP Readings from Last 3 Encounters:  12/04/16 124/78  04/09/16 120/78  01/23/16 108/74   Good control

## 2016-12-04 NOTE — Assessment & Plan Note (Signed)
Albuterol only occasionally

## 2016-12-19 ENCOUNTER — Other Ambulatory Visit: Payer: Self-pay | Admitting: Internal Medicine

## 2017-01-08 ENCOUNTER — Telehealth: Payer: Self-pay

## 2017-01-08 NOTE — Telephone Encounter (Signed)
Pt left /vm; pt has recurrent bronchitis; pt has bronchitis now and pt has inhaler but needs cough med or abx. [facility] [address]. Last annual 12/04/16 and last acute 04/09/16. Pt request cb.

## 2017-01-09 ENCOUNTER — Encounter: Payer: Self-pay | Admitting: Internal Medicine

## 2017-01-09 ENCOUNTER — Ambulatory Visit (INDEPENDENT_AMBULATORY_CARE_PROVIDER_SITE_OTHER): Payer: 59 | Admitting: Internal Medicine

## 2017-01-09 VITALS — BP 122/78 | HR 64 | Temp 98.5°F | Wt 301.0 lb

## 2017-01-09 DIAGNOSIS — R05 Cough: Secondary | ICD-10-CM

## 2017-01-09 DIAGNOSIS — R059 Cough, unspecified: Secondary | ICD-10-CM

## 2017-01-09 MED ORDER — HYDROCODONE-HOMATROPINE 5-1.5 MG/5ML PO SYRP: 5.0000 mL | ORAL_SOLUTION | Freq: Three times a day (TID) | ORAL | 0 refills | Status: DC | PRN

## 2017-01-09 NOTE — Telephone Encounter (Signed)
Left message to call office

## 2017-01-09 NOTE — Patient Instructions (Signed)

## 2017-01-09 NOTE — Telephone Encounter (Signed)
I just realized the pt saw Webb Silversmith at 3pm today.

## 2017-01-09 NOTE — Progress Notes (Signed)
Subjective:    Patient ID: Lindsey Walls, female    DOB: 03/26/1971, 46 y.o.   MRN: [id number]  HPI  Pt presents to the clinic today with sore throat, cough and mild shortness of breath. She reports this started 2 days ago. She denies difficulty swallowing. The cough is non productive. She denies runny nose, nasal congestion, ear pain or chest tightness. She denies fever, chills or body aches. She has tried her Albuterol inhaler with some relief. She has a history of chronic bronchitis. She has not had sick contacts that she is aware of.   Review of Systems  Past Medical History:  Diagnosis Date  . Anemia   . Bronchitis, chronic ([place])   . Chronic back pain   . Fibroid   . Hyperlipidemia   . Hypertension   . Obesity   . Obesity     Current Outpatient Prescriptions  Medication Sig Dispense Refill  . albuterol (PROAIR HFA) 108 (90 Base) MCG/ACT inhaler inhale 2 puffs by mouth every 6 hours if needed 18 g 1  . lisinopril-hydrochlorothiazide (PRINZIDE,ZESTORETIC) 20-25 MG tablet take 1 tablet by mouth once daily 30 tablet 11  . naproxen (NAPROSYN) 500 MG tablet take 1 tablet by mouth twice a day if needed 60 tablet 0   No current facility-administered medications for this visit.     No Known Allergies  Family History  Problem Relation Age of Onset  . Coronary artery disease Mother   . Hypertension Father   . Cancer Father        lung  . Coronary artery disease Sister     Social History   Social History  . Marital status: Married    Spouse name: N/A  . Number of children: 2  . Years of education: N/A   Occupational History  . 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 Smoking status: Never Smoker  . Smokeless tobacco: Never Used  . Alcohol use No  . Drug use: No  . Sexual activity: Not on file   Other Topics Concern  . Not on file   Social History Narrative  . No narrative on file     Constitutional: Denies fever, malaise,  fatigue, headache or abrupt weight changes.  HEENT: Pt reports sore throat. Denies eye pain, eye redness, ear pain, ringing in the ears, wax buildup, runny nose, nasal congestion, bloody nose. Respiratory: Pt reports cough and shortness of breath. Denies difficulty breathing, or sputum production.    No other specific complaints in a complete review of systems (except as listed in HPI above).     Objective:   Physical Exam  BP 122/78   Pulse 64   Temp 98.5 F (36.9 C) (Oral)   Wt (!) 301 lb (136.5 kg)   LMP 10/12/2012   SpO2 99%   BMI 48.95 kg/m  Wt Readings from Last 3 Encounters:  01/09/17 (!) 301 lb (136.5 kg)  12/04/16 (!) 303 lb (137.4 kg)  04/09/16 (!) 311 lb (141.1 kg)    General: Appears her stated age, obese in NAD. Throat/Mouth: Teeth present, mucosa pink and moist, no exudate, lesions or ulcerations noted.  Neck:  No adenopathy noted.  Pulmonary/Chest: Normal effort and positive vesicular breath sounds. No respiratory distress. No wheezes, rales or ronchi noted.   BMET    Component Value Date/Time   NA 143 12/04/2016 0954   K 4.4 12/04/2016 0954   CL 105 12/04/2016 0954   CO2 33 (H)  12/04/2016 0954   GLUCOSE 117 (H) 12/04/2016 0954   BUN 13 12/04/2016 0954   CREATININE 0.79 12/04/2016 0954   CALCIUM 10.0 12/04/2016 0954   GFRNONAA 82 (L) 12/13/2012 1600   GFRAA >90 12/13/2012 1600    Lipid Panel     Component Value Date/Time   CHOL 240 (H) 12/04/2016 0954   TRIG 109.0 12/04/2016 0954   HDL 45.80 12/04/2016 0954   CHOLHDL 5 12/04/2016 0954   VLDL 21.8 12/04/2016 0954   LDLCALC 173 (H) 12/04/2016 0954    CBC    Component Value Date/Time   WBC 8.7 12/04/2016 0954   RBC 4.74 12/04/2016 0954   HGB 12.7 12/04/2016 0954   HCT 39.7 12/04/2016 0954   PLT 334.0 12/04/2016 0954   MCV 83.8 12/04/2016 0954   MCH 25.9 (L) 12/14/2012 0515   MCHC 32.1 12/04/2016 0954   RDW 13.8 12/04/2016 0954   LYMPHSABS 3.7 12/04/2016 0954   MONOABS 0.6 12/04/2016  0954   EOSABS 0.1 12/04/2016 0954   BASOSABS 0.0 12/04/2016 0954    Hgb A1C Lab Results  Component Value Date   HGBA1C 6.5 12/04/2016            Assessment & Plan:   Cough, Viral:  No s/s of bronchitis, no indication for abx Continue Albuterol prn Can use Mucinex 600 mg every 12 hours RX for Hycodan for cough  RTC as needed or if symptoms persist or worsen Webb Silversmith, NP

## 2017-01-09 NOTE — Telephone Encounter (Signed)
Let her know that most bronchitis is viral and doesn't need antibiotics (so I would not send antibiotic order). If she has no fever or SOB, okay to send order for robitussin with codeine syrup #120 cc x 0 5-10cc four times a day prn cough

## 2017-03-14 ENCOUNTER — Other Ambulatory Visit: Payer: Self-pay | Admitting: Internal Medicine

## 2017-03-14 NOTE — Telephone Encounter (Signed)
Copied from [place] [id number]. Topic: Quick Communication - Rx Refill/Question >> Mar 14, 2017  2:33 PM Ahmed Prima L wrote: Has the patient contacted their pharmacy? yes   (Agent: If no, request that the patient contact the pharmacy for the refill.)   Preferred Pharmacy (with phone number or street name): [facility] on [address]: Please be advised that RX refills may take up to 3 business days. We ask that you follow-up with your pharmacy.  NAPROXEN 500mg .

## 2017-05-26 ENCOUNTER — Other Ambulatory Visit: Payer: Self-pay | Admitting: Internal Medicine

## 2017-05-26 ENCOUNTER — Telehealth: Payer: Self-pay | Admitting: Internal Medicine

## 2017-05-26 NOTE — Telephone Encounter (Signed)
Refill of Hycodan  LOV 01/09/17  LRF 01/09/17    75cc   0 refills   [facility], [place] [phone] (Phone)   [phone] (Fax).

## 2017-05-26 NOTE — Telephone Encounter (Signed)
Request for hycodan to [facility] seen and  refilled 75 ml on 01/09/17. Please advise. See separate refill request for 05/26/17.

## 2017-05-26 NOTE — Telephone Encounter (Signed)
This is not appropriate

## 2017-05-26 NOTE — Telephone Encounter (Signed)
Copied from [place]. Topic: Inquiry >> May 26, 2017  2:27 PM Pricilla Handler wrote: Reason for CRM: Patient called requesting a refill of HYDROcodone-homatropine (HYCODAN) 5-1.5 MG/5ML syrup. Patient's preferred pharmacy is [facility], [place] [phone] (Phone)   [phone] (Fax).       Thank You!!!

## 2017-05-27 ENCOUNTER — Other Ambulatory Visit: Payer: Self-pay | Admitting: Internal Medicine

## 2017-05-27 MED ORDER — HYDROCODONE-HOMATROPINE 5-1.5 MG/5ML PO SYRP: 5.0000 mL | ORAL_SOLUTION | Freq: Three times a day (TID) | ORAL | 0 refills | Status: DC | PRN

## 2017-05-27 NOTE — Telephone Encounter (Signed)
Copied from [place] [id number]. Topic: Quick Communication - Rx Refill/Question >> May 27, 2017 11:46 AM Oliver Pila B wrote: Medication: HYDROcodone-homatropine (HYCODAN) 5-1.5 MG/5ML syrup [[id number]]   Pt is needing refill on medication, she is needing her pcp Dr. Silvio Pate to refill it, pt states Dr. Silvio Pate is aware that the pt get bronchitis 2x a year, contact pt to advise

## 2017-05-27 NOTE — Telephone Encounter (Signed)
Requesting hycodan refill. Last refilled# 75 ml on 01/09/17. Last acute visit 01/09/17 and last annual 12/04/16. Please advise.

## 2017-05-27 NOTE — Telephone Encounter (Signed)
Spoke to pt. Advised pt it was sent to the pharmacy

## 2017-05-27 NOTE — Telephone Encounter (Signed)
sent msg via mychart

## 2017-06-11 ENCOUNTER — Other Ambulatory Visit: Payer: Self-pay | Admitting: Internal Medicine

## 2017-07-09 ENCOUNTER — Encounter: Payer: Self-pay | Admitting: Family Medicine

## 2017-07-09 ENCOUNTER — Encounter: Payer: Self-pay | Admitting: *Deleted

## 2017-07-09 ENCOUNTER — Ambulatory Visit: Payer: 59 | Admitting: Family Medicine

## 2017-07-09 VITALS — BP 128/80 | HR 87 | Temp 103.0°F | Wt 302.5 lb

## 2017-07-09 DIAGNOSIS — M791 Myalgia, unspecified site: Secondary | ICD-10-CM | POA: Diagnosis not present

## 2017-07-09 DIAGNOSIS — J029 Acute pharyngitis, unspecified: Secondary | ICD-10-CM

## 2017-07-09 DIAGNOSIS — R509 Fever, unspecified: Secondary | ICD-10-CM

## 2017-07-09 LAB — POCT INFLUENZA A/B
INFLUENZA B, POC: NEGATIVE
Influenza A, POC: NEGATIVE

## 2017-07-09 LAB — POCT RAPID STREP A (OFFICE): RAPID STREP A SCREEN: NEGATIVE

## 2017-07-09 NOTE — Progress Notes (Signed)
Subjective:    Patient ID: Lindsey Walls, female    DOB: 05-30-1970, 47 y.o.   MRN: [id number]  HPI This is a 47 yo female who presents today with sore throat x 2 days.  Started yesterday morning, felt worse as day went on, had chills. No ear pain, no runny nose, no cough. Has taken alkaseltzer cold without relief. Achy all over.  No shortness of breath or wheeze.  Able to eat and drink but has had a little fluids or food today.  She has not had anything for fever recently. No known strep throat contacts.  Some coworkers have been sick with cold symptoms.  Past Medical History:  Diagnosis Date  . Anemia   . Bronchitis, chronic ([place])   . Chronic back pain   . Fibroid   . Hyperlipidemia   . Hypertension   . Obesity   . Obesity    Past Surgical History:  Procedure Laterality Date  . ABDOMINAL HYSTERECTOMY N/A 11/23/2012   Procedure: HYSTERECTOMY ABDOMINAL with repair of laproscopic torcar incision; lysis of adhesions ;  Surgeon: Daria Pastures, MD;  Location: [facility];  Service: Gynecology;  Laterality: N/A;  . CESAREAN SECTION    . CHOLECYSTECTOMY    . CYSTOSCOPY N/A 11/23/2012   Procedure: CYSTOSCOPY;  Surgeon: Daria Pastures, MD;  Location: [facility];  Service: Gynecology;  Laterality: N/A;  . ENDOMETRIAL ABLATION  08/08  . OMENTECTOMY N/A 11/23/2012   Procedure: OMENTECTOMY;  Surgeon: Daria Pastures, MD;  Location: [facility];  Service: Gynecology;  Laterality: N/A;  . ROBOTIC ASSISTED TOTAL HYSTERECTOMY N/A 11/23/2012   Procedure: ATTEMPTED ROBOTIC ASSISTED TOTAL HYSTERECTOMY;  Surgeon: Daria Pastures, MD;  Location: [facility];  Service: Gynecology;  Laterality: N/A;  . TIBIA FRACTURE SURGERY     Family History  Problem Relation Age of Onset  . Coronary artery disease Mother   . Hypertension Father   . Cancer Father        lung  . Coronary artery disease Sister    Social History   Tobacco Use  . Smoking status: Never Smoker  . Smokeless tobacco: Never Used    Substance Use Topics  . Alcohol use: No    Alcohol/week: 0.0 oz  . Drug use: No      Review of Systems Per HPI    Objective:   Physical Exam  Constitutional: She is oriented to person, place, and time. She appears well-developed and well-nourished. She appears ill. No distress.  HENT:  Head: Normocephalic and atraumatic.  Right Ear: Tympanic membrane, external ear and ear canal normal.  Left Ear: Tympanic membrane, external ear and ear canal normal.  Nose: Nose normal.  Mouth/Throat: Uvula is midline. Mucous membranes are dry. Oropharyngeal exudate (large amount bilateral tonsils) and posterior oropharyngeal erythema present. No posterior oropharyngeal edema or tonsillar abscesses.  Eyes: Conjunctivae are normal.  Neck: Normal range of motion. Neck supple.  Cardiovascular: Normal rate, regular rhythm and normal heart sounds.  Pulmonary/Chest: Effort normal and breath sounds normal.  Lymphadenopathy:    She has no cervical adenopathy.  Neurological: She is alert and oriented to person, place, and time.  Skin: Skin is warm and dry. She is not diaphoretic.  Psychiatric: She has a normal mood and affect. Her behavior is normal. Judgment and thought content normal.  Vitals reviewed.     BP 128/80   Pulse 87   Temp (!) 103 F (39.4 C) (Oral)   Wt (!) 302 lb 8  oz (137.2 kg)   LMP 10/12/2012   SpO2 94%   BMI 49.20 kg/m  Wt Readings from Last 3 Encounters:  07/09/17 (!) 302 lb 8 oz (137.2 kg)  01/09/17 (!) 301 lb (136.5 kg)  12/04/16 (!) 303 lb (137.4 kg)   POCT rapid strep A- negative    Assessment & Plan:  1. Fever, unspecified fever cause -Patient given ibuprofen 600 mg and water in the office -Encouraged her to push fluids and rest - POCT Influenza A/B - Rapid Strep A - Culture, Group A Strep  2. Myalgia - POCT Influenza A/B - Rapid Strep A - Culture, Group A Strep  3. Pharyngitis, unspecified etiology -Negative rapid strep but will send for  culture -RTC/ER precautions reviewed - Rapid Strep A - Culture, Group A Strep   Clarene Reamer, FNP-BC   Primary Care at [facility]  07/12/2017 9:13 AM

## 2017-07-09 NOTE — Patient Instructions (Signed)
Your strep test and flu test are both negative  I am checking a throat culture to double check  You likely have a viral illness, there is nothing to cure a virus, you want to treat the symptoms with the following  For nasal congestion you can use Afrin nasal spray for 3 days max, Sudafed, saline nasal spray (generic is fine for all). For cough you can try Delsym. Drink enough fluids to make your urine light yellow. For fever/chill/muscle aches you can take over the counter acetaminophen or ibuprofen- can alternate.  Please come back in if you are not better in 5-7 days or if you develop wheezing, shortness of breath or persistent vomiting.   Pharyngitis Pharyngitis is redness, pain, and swelling (inflammation) of the throat (pharynx). It is a very common cause of sore throat. Pharyngitis can be caused by a bacteria, but it is usually caused by a virus. Most cases of pharyngitis get better on their own without treatment. What are the causes? This condition may be caused by:  Infection by viruses (viral). Viral pharyngitis spreads from person to person (is contagious) through coughing, sneezing, and sharing of personal items or utensils such as cups, forks, spoons, and toothbrushes.  Infection by bacteria (bacterial). Bacterial pharyngitis may be spread by touching the nose or face after coming in contact with the bacteria, or through more intimate contact, such as kissing.  Allergies. Allergies can cause buildup of mucus in the throat (post-nasal drip), leading to inflammation and irritation. Allergies can also cause blocked nasal passages, forcing breathing through the mouth, which dries and irritates the throat.  What increases the risk? You are more likely to develop this condition if:  You are 13-53 years old.  You are exposed to crowded environments such as daycare, school, or dormitory living.  You live in a cold climate.  You have a weakened disease-fighting (immune)  system.  What are the signs or symptoms? Symptoms of this condition vary by the cause (viral, bacterial, or allergies) and can include:  Sore throat.  Fatigue.  Low-grade fever.  Headache.  Joint pain and muscle aches.  Skin rashes.  Swollen glands in the throat (lymph nodes).  Plaque-like film on the throat or tonsils. This is often a symptom of bacterial pharyngitis.  Vomiting.  Stuffy nose (nasal congestion).  Cough.  Red, itchy eyes (conjunctivitis).  Loss of appetite.  How is this diagnosed? This condition is often diagnosed based on your medical history and a physical exam. Your health care provider will ask you questions about your illness and your symptoms. A swab of your throat may be done to check for bacteria (rapid strep test). Other lab tests may also be done, depending on the suspected cause, but these are rare. How is this treated? This condition usually gets better in 3-4 days without medicine. Bacterial pharyngitis may be treated with antibiotic medicines. Follow these instructions at home:  Take over-the-counter and prescription medicines only as told by your health care provider. ? If you were prescribed an antibiotic medicine, take it as told by your health care provider. Do not stop taking the antibiotic even if you start to feel better. ? Do not give children aspirin because of the association with Reye syndrome.  Drink enough water and fluids to keep your urine clear or pale yellow.  Get a lot of rest.  Gargle with a salt-water mixture 3-4 times a day or as needed. To make a salt-water mixture, completely dissolve -1 tsp of salt  in 1 cup of warm water.  If your health care provider approves, you may use throat lozenges or sprays to soothe your throat. Contact a health care provider if:  You have large, tender lumps in your neck.  You have a rash.  You cough up green, yellow-brown, or bloody spit. Get help right away if:  Your neck  becomes stiff.  You drool or are unable to swallow liquids.  You cannot drink or take medicines without vomiting.  You have severe pain that does not go away, even after you take medicine.  You have trouble breathing, and it is not caused by a stuffy nose.  You have new pain and swelling in your joints such as the knees, ankles, wrists, or elbows. Summary  Pharyngitis is redness, pain, and swelling (inflammation) of the throat (pharynx).  While pharyngitis can be caused by a bacteria, the most common causes are viral.  Most cases of pharyngitis get better on their own without treatment.  Bacterial pharyngitis is treated with antibiotic medicines. This information is not intended to replace advice given to you by your health care provider. Make sure you discuss any questions you have with your health care provider. Document Released: 03/25/2005 Document Revised: 04/30/2016 Document Reviewed: 04/30/2016 Elsevier Interactive Patient Education  [employer].

## 2017-07-10 ENCOUNTER — Ambulatory Visit: Payer: Self-pay | Admitting: *Deleted

## 2017-07-10 NOTE — Telephone Encounter (Signed)
Pt has appt with Glenda Chroman FNP 07/11/17 at 10:30. Since pt was hurting so badly and pt was seen 07/09/17 I am sending this note to Dr Silvio Pate, pts PCP for review.

## 2017-07-10 NOTE — Telephone Encounter (Signed)
This is the skype sent to me prior to this phone note.  [07/10/2017 2:00 PM]  Dorene Ar:   hi. I have Lindsey Walls on the phone, mrn [phone], BD 08/18/2070 had an appt with D. Carlean Purl yesterday. she was checked for strep throat. Test there was neg. c/o excruciating throat pain. care advice given. wants to know if an antibiotic can be called in. states has h hx of getting strep throat every year. thanks   [07/10/2017 2:08 PM]  Ozzie Hoyle:   I do not know for sure; Glenda Chroman FNP not in office today but she will be back on 07/11/17. Sounds like pt is in alot of pain and I can send note to provider in office. please send phone note with local pharmacy and pt contact info and I will fwd to provider in office. thank you.   [07/10/2017 2:12 PM]  Dorene Ar:   ok. thanks. I made her an appointment according to protocol for tomorrow. So if they can send it something I call her and cancel this appointment. Thanks so much. Finishing up triage.     [07/10/2017 2:18 PM]  Dorene Ar:   [facility] her# is [phone] thanks again

## 2017-07-10 NOTE — Telephone Encounter (Signed)
Pt called with severe sore throat. Had office visit yesterday but throat was not this bad. She had pus on her tonsils. No fever. Has taken ibuprofen for the body aches that she had been experiencing. States she has hx of strep throat. Home care advice given to patient with verbal understanding.  Appointment made per protocol. She is requesting an antibiotic to be called in. Flow notified at [facility].   Reason for Disposition . SEVERE (e.g., excruciating) throat pain  Answer Assessment - Initial Assessment Questions 1. ONSET: "When did the throat start hurting?" (Hours or days ago)      Tuesday 2. SEVERITY: "How bad is the sore throat?" (Scale 1-10; mild, moderate or severe)   - MILD (1-3):  doesn't interfere with eating or normal activities   - MODERATE (4-7): interferes with eating some solids and normal activities   - SEVERE (8-10):  excruciating pain, interferes with most normal activities   - SEVERE DYSPHAGIA: can't swallow liquids, drooling     severe 3. STREP EXPOSURE: "Has there been any exposure to strep within the past week?" If so, ask: "What type of contact occurred?"      Yes a child with strep throat with a hug 4.  VIRAL SYMPTOMS: "Are there any symptoms of a cold, such as a runny nose, cough, hoarse voice or red eyes?"      Hoarse, cough 5. FEVER: "Do you have a fever?" If so, ask: "What is your temperature, how was it measured, and when did it start?"     No  6. PUS ON THE TONSILS: "Is there pus on the tonsils in the back of your throat?"     yes 7. OTHER SYMPTOMS: "Do you have any other symptoms?" (e.g., difficulty breathing, headache, rash)     Slight headache, sore neck (stiff) 8. PREGNANCY: "Is there any chance you are pregnant?" "When was your last menstrual period?"     No, had  hysterectomy  Protocols used: SORE THROAT-A-AH

## 2017-07-10 NOTE — Telephone Encounter (Signed)
Spoke to pt. She will see how she feels tomorrow and decide if she needs the appt.

## 2017-07-10 NOTE — Telephone Encounter (Signed)
With her current symptoms, it sounds like a viral syndrome and she may just want to wait it out another couple of days. She can certainly keep tomorrow's appt if she is not any better, but it is unlikely that we will have treatment options that are different

## 2017-07-11 ENCOUNTER — Ambulatory Visit: Payer: 59 | Admitting: Family Medicine

## 2017-07-11 LAB — CULTURE, GROUP A STREP
MICRO NUMBER: 90412164
SPECIMEN QUALITY:: ADEQUATE

## 2017-12-19 ENCOUNTER — Other Ambulatory Visit: Payer: Self-pay | Admitting: Internal Medicine

## 2017-12-19 NOTE — Telephone Encounter (Addendum)
Unable to reach pt for information on symptoms. Pt called back; pt is in beginning stage of bronchitis; gets it twice a year; pt taking alka seltzer, using albuterol inhaler which is helping somewhat, pt feels like mucus gets into chest with some wheezing but not a lot of wheezing so far; pt requesting refill on hydrocodone homatropine last refilled #75 ml on 05/27/17 to [facility] [address] No H/A, feels warm at times so not sure if had fever, but does not think it is high fever if any. Dr Silvio Pate is out of office this afternoon and pt said she will be out of cough med by 12/20/17. [facility] [address]Please advise.

## 2017-12-19 NOTE — Telephone Encounter (Signed)
I cannot refill this w/o a visit since it is a controlled substance  Please offer a Saturday clinic appt  I will cc to PCP

## 2017-12-19 NOTE — Telephone Encounter (Signed)
Copied from [place] [id number]. Topic: Quick Communication - See Telephone Encounter >> Dec 19, 2017  3:45 PM Neva Seat wrote: Pt needing cough syrup for bronchitis.  Pt states Dr. Silvio Pate calls this in for her since she get bronchitis 2 times a year.  [facility] - on [address] in [place] [phone]

## 2017-12-21 MED ORDER — HYDROCODONE-HOMATROPINE 5-1.5 MG/5ML PO SYRP: 5.0000 mL | ORAL_SOLUTION | Freq: Three times a day (TID) | ORAL | 0 refills | Status: DC | PRN

## 2017-12-22 NOTE — Telephone Encounter (Signed)
Spoke to pt. She said she is feeling a little better today and was thankful that Dr Silvio Pate had called in the medication for her yesterday.

## 2017-12-29 ENCOUNTER — Other Ambulatory Visit: Payer: Self-pay | Admitting: Internal Medicine

## 2018-01-05 ENCOUNTER — Encounter: Payer: Self-pay | Admitting: Internal Medicine

## 2018-01-05 ENCOUNTER — Ambulatory Visit (INDEPENDENT_AMBULATORY_CARE_PROVIDER_SITE_OTHER): Payer: 59 | Admitting: Internal Medicine

## 2018-01-05 VITALS — BP 126/84 | HR 76 | Temp 98.3°F | Wt 306.0 lb

## 2018-01-05 DIAGNOSIS — B9789 Other viral agents as the cause of diseases classified elsewhere: Secondary | ICD-10-CM | POA: Diagnosis not present

## 2018-01-05 DIAGNOSIS — J069 Acute upper respiratory infection, unspecified: Secondary | ICD-10-CM

## 2018-01-05 MED ORDER — HYDROCODONE-HOMATROPINE 5-1.5 MG/5ML PO SYRP: 5.0000 mL | ORAL_SOLUTION | Freq: Three times a day (TID) | ORAL | 0 refills | Status: DC | PRN

## 2018-01-05 MED ORDER — PREDNISONE 10 MG PO TABS: ORAL_TABLET | ORAL | 0 refills | Status: DC

## 2018-01-05 NOTE — Progress Notes (Signed)
HPI  Pt presents to the clinic today with c/o nasal congestion, sore throat and cough. She reports this started 5 days ago. She is blowing  Clear mucous out of her nose. She denies difficulty swallowing but reports a burning sensation in her throat. The cough is non productive. She denies fever, chills ro body aches. She has tried Copywriter, advertising , [place] and her Albuterol inhaler with some relief. She has a history of asthma/chronic bronchitis. She has not had sick contacts that she is aware of.  Review of Systems      Past Medical History:  Diagnosis Date  . Anemia   . Bronchitis, chronic ([place])   . Chronic back pain   . Fibroid   . Hyperlipidemia   . Hypertension   . Obesity   . Obesity     Family History  Problem Relation Age of Onset  . Coronary artery disease Mother   . Hypertension Father   . Cancer Father        lung  . Coronary artery disease Sister     Social History   Socioeconomic History  . Marital status: Married    Spouse name: Not on file  . Number of children: 2  . Years of education: Not on file  . Highest education level: Not on file  Occupational History  . 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 Financial resource strain: Not on file  . Food insecurity:    Worry: Not on file    Inability: Not on file  . Transportation needs:    Medical: Not on file    Non-medical: Not on file  Tobacco Use  . Smoking status: Never Smoker  . Smokeless tobacco: Never Used  Substance and Sexual Activity  . Alcohol use: No    Alcohol/week: 0.0 standard drinks  . Drug use: No  . Sexual activity: Not on file  Lifestyle  . Physical activity:    Days per week: Not on file    Minutes per session: Not on file  . Stress: Not on file  Relationships  . Social connections:    Talks on phone: Not on file    Gets together: Not on file    Attends religious service: Not on file    Active member of club or organization: Not on file    Attends  meetings of clubs or organizations: Not on file    Relationship status: Not on file  . Intimate partner violence:    Fear of current or ex partner: Not on file    Emotionally abused: Not on file    Physically abused: Not on file    Forced sexual activity: Not on file  Other Topics Concern  . Not on file  Social History Narrative  . Not on file    No Known Allergies   Constitutional: Denies headache, fatigue, fever or abrupt weight changes.  HEENT:  Positive nasal congestion, ear pain, sore throat. Denies eye redness, eye pain, pressure behind the eyes, facial pain, ringing in the ears, wax buildup, runny nose or bloody nose. Respiratory: Positive cough. Denies difficulty breathing or shortness of breath.  Cardiovascular: Denies chest pain, chest tightness, palpitations or swelling in the hands or feet.   No other specific complaints in a complete review of systems (except as listed in HPI above).  Objective:   BP 126/84   Pulse 76   Temp 98.3 F (36.8 C) (Oral)   Wt (!) 306 lb (138.8 kg)  LMP 10/12/2012   SpO2 98%   BMI 49.77 kg/m  Wt Readings from Last 3 Encounters:  01/05/18 (!) 306 lb (138.8 kg)  07/09/17 (!) 302 lb 8 oz (137.2 kg)  01/09/17 (!) 301 lb (136.5 kg)     General: Appears her stated age, obese in NAD. HEENT: Head: normal shape and size, no sinus tenderness noted;  Ears: Tm's gray and intact, normal light reflex; Nose: mucosa boggy and moist, turbinates swollen; Throat/Mouth: + PND. Teeth present, mucosa erythematous and moist, no exudate noted, no lesions or ulcerations noted.  Neck: No cervical lymphadenopathy.  Cardiovascular: Normal rate and rhythm. Distant S1,S2 noted. Pulmonary/Chest: Normal effort and positive vesicular breath sounds with intermittent expiratory wheeze noted. No respiratory distress. No rales or ronchi noted.       Assessment & Plan:   Acute Viral Upper Respiratory Infection with Cough:  Get some rest and drink plenty of  water Do salt water gargles for the sore throat eRx for Pred Taper x 6 days eRx for Hycodan cough syrup  RTC as needed or if symptoms persist.   Webb Silversmith, NP

## 2018-01-05 NOTE — Patient Instructions (Signed)

## 2018-04-01 ENCOUNTER — Other Ambulatory Visit: Payer: Self-pay | Admitting: Internal Medicine

## 2018-04-03 ENCOUNTER — Other Ambulatory Visit: Payer: Self-pay | Admitting: Internal Medicine

## 2018-07-01 ENCOUNTER — Other Ambulatory Visit: Payer: Self-pay | Admitting: Internal Medicine

## 2018-08-12 ENCOUNTER — Encounter: Payer: Self-pay | Admitting: Internal Medicine

## 2018-08-12 ENCOUNTER — Ambulatory Visit (INDEPENDENT_AMBULATORY_CARE_PROVIDER_SITE_OTHER): Payer: 59 | Admitting: Internal Medicine

## 2018-08-12 DIAGNOSIS — J4521 Mild intermittent asthma with (acute) exacerbation: Secondary | ICD-10-CM

## 2018-08-12 DIAGNOSIS — J45909 Unspecified asthma, uncomplicated: Secondary | ICD-10-CM | POA: Insufficient documentation

## 2018-08-12 MED ORDER — PREDNISONE 20 MG PO TABS: 40.0000 mg | ORAL_TABLET | Freq: Every day | ORAL | 0 refills | Status: DC

## 2018-08-12 MED ORDER — HYDROCODONE-HOMATROPINE 5-1.5 MG/5ML PO SYRP: 5.0000 mL | ORAL_SOLUTION | Freq: Three times a day (TID) | ORAL | 0 refills | Status: DC | PRN

## 2018-08-12 NOTE — Progress Notes (Signed)
Subjective:    Patient ID: Lindsey Walls, female    DOB: [birth year], 48 y.o.   MRN: [id number]  HPI Virtual visit due to respiratory illness Identification done Reviewed billing and she gave consent She is in her home--I am in my office  She is having her typical bronchitis Can feel it getting tight in her chest---though not bad Bad cough---making throat sore Able to lie down--so not as bad as sometimes Started 2-3 days ago No fever Doesn't feel really bad--"not dragging" (but did take nap yesterday) Not really SOB--but is using inhaler No ear pain or headache Mild sinus drainage---more sneezing/coughing when she goes outside Cough is still dry Mild effect on her cough--helped by the narcotic cough syrup (needs refill)  No other medications  Current Outpatient Medications on File Prior to Visit  Medication Sig Dispense Refill  . albuterol (VENTOLIN HFA) 108 (90 Base) MCG/ACT inhaler inhale 2 puffs by mouth every 6 hours if needed 18 g 1  . lisinopril-hydrochlorothiazide (PRINZIDE,ZESTORETIC) 20-25 MG tablet TAKE 1 TABLET BY MOUTH DAILY 30 tablet 1  . naproxen (NAPROSYN) 500 MG tablet Take 1 tablet (500 mg total) by mouth 2 (two) times daily as needed. NEEDS TO SCHEDULE PHYSICAL 60 tablet 0   No current facility-administered medications on file prior to visit.     No Known Allergies  Past Medical History:  Diagnosis Date  . Anemia   . Bronchitis, chronic ([place])   . Chronic back pain   . Fibroid   . Hyperlipidemia   . Hypertension   . Obesity   . Obesity     Past Surgical History:  Procedure Laterality Date  . ABDOMINAL HYSTERECTOMY N/A 11/23/2012   Procedure: HYSTERECTOMY ABDOMINAL with repair of laproscopic torcar incision; lysis of adhesions ;  Surgeon: Daria Pastures, MD;  Location: [facility];  Service: Gynecology;  Laterality: N/A;  . CESAREAN SECTION    . CHOLECYSTECTOMY    . CYSTOSCOPY N/A 11/23/2012   Procedure: CYSTOSCOPY;  Surgeon: Daria Pastures,  MD;  Location: [facility];  Service: Gynecology;  Laterality: N/A;  . ENDOMETRIAL ABLATION  08/08  . OMENTECTOMY N/A 11/23/2012   Procedure: OMENTECTOMY;  Surgeon: Daria Pastures, MD;  Location: [facility];  Service: Gynecology;  Laterality: N/A;  . ROBOTIC ASSISTED TOTAL HYSTERECTOMY N/A 11/23/2012   Procedure: ATTEMPTED ROBOTIC ASSISTED TOTAL HYSTERECTOMY;  Surgeon: Daria Pastures, MD;  Location: [facility];  Service: Gynecology;  Laterality: N/A;  . TIBIA FRACTURE SURGERY      Family History  Problem Relation Age of Onset  . Coronary artery disease Mother   . Hypertension Father   . Cancer Father        lung  . Coronary artery disease Sister     Social History   Socioeconomic History  . Marital status: Married    Spouse name: Not on file  . Number of children: 2  . Years of education: Not on file  . Highest education level: Not on file  Occupational History  . 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 Financial resource strain: Not on file  . Food insecurity:    Worry: Not on file    Inability: Not on file  . Transportation needs:    Medical: Not on file    Non-medical: Not on file  Tobacco Use  . Smoking status: Never Smoker  . Smokeless tobacco: Never Used  Substance and Sexual Activity  . Alcohol use: No    Alcohol/week: 0.0  standard drinks  . Drug use: No  . Sexual activity: Not on file  Lifestyle  . Physical activity:    Days per week: Not on file    Minutes per session: Not on file  . Stress: Not on file  Relationships  . Social connections:    Talks on phone: Not on file    Gets together: Not on file    Attends religious service: Not on file    Active member of club or organization: Not on file    Attends meetings of clubs or organizations: Not on file    Relationship status: Not on file  . Intimate partner violence:    Fear of current or ex partner: Not on file    Emotionally abused: Not on file    Physically abused: Not on file     Forced sexual activity: Not on file  Other Topics Concern  . Not on file  Social History Narrative  . Not on file   Review of Systems Working remotely now. No vomiting or diarrhea Appetite is okay    Objective:   Physical Exam  Constitutional: She appears well-developed. No distress.  Respiratory: Effort normal. No respiratory distress.  Psychiatric: She has a normal mood and affect. Her behavior is normal.           Assessment & Plan:

## 2018-08-12 NOTE — Assessment & Plan Note (Signed)
Mild exacerbation Seems to be viral infection--so will hold off on antibiotic unless she worsens Will give prednisone and renew cough syrup Discussed COVID precautions--stay home for 10 days from onset and 3 days of no symptoms

## 2018-09-05 ENCOUNTER — Other Ambulatory Visit: Payer: Self-pay | Admitting: Internal Medicine

## 2018-10-08 ENCOUNTER — Other Ambulatory Visit: Payer: Self-pay | Admitting: Internal Medicine

## 2018-10-09 ENCOUNTER — Other Ambulatory Visit: Payer: Self-pay | Admitting: Internal Medicine

## 2018-11-13 ENCOUNTER — Other Ambulatory Visit: Payer: Self-pay | Admitting: Internal Medicine

## 2018-11-24 ENCOUNTER — Other Ambulatory Visit: Payer: Self-pay | Admitting: Internal Medicine

## 2018-12-14 ENCOUNTER — Telehealth: Payer: Self-pay | Admitting: Internal Medicine

## 2018-12-24 NOTE — Telephone Encounter (Signed)
Pt scheduled physical for 12/9. She requested a refill on lisinopril and I told her it looks like it was already sent over to pharmacy. She states they wouldn't refill after September until she sees Dr. Silvio Pate. She is worried about being out of medication until physical in December.

## 2018-12-24 NOTE — Telephone Encounter (Signed)
Spoke to pt. Advised her that we would not let her go without medication. When we get the request, we will make sure she has enough to last her until her appointment.

## 2019-01-15 ENCOUNTER — Other Ambulatory Visit: Payer: Self-pay | Admitting: Internal Medicine

## 2019-03-16 ENCOUNTER — Other Ambulatory Visit: Payer: Self-pay | Admitting: Internal Medicine

## 2019-03-16 ENCOUNTER — Telehealth: Payer: Self-pay | Admitting: Internal Medicine

## 2019-03-16 NOTE — Telephone Encounter (Signed)
Patient had an appointment for a physical tomorrow.  Patient said she got a new job and won't have insurance until January.  Patient rescheduled appointment to 04/27/19.  Can patient get a refill on her bp medication?   Patient has 5 pills left.  Patient uses [facility].

## 2019-03-17 ENCOUNTER — Ambulatory Visit: Payer: 59 | Admitting: Internal Medicine

## 2019-03-17 NOTE — Telephone Encounter (Signed)
Spoke to pt. Advised her that she should have a refill for this month and then we can fill it next month prior to her appointment.

## 2019-04-23 ENCOUNTER — Other Ambulatory Visit: Payer: Self-pay | Admitting: *Deleted

## 2019-04-23 NOTE — Telephone Encounter (Signed)
Last office visit 05/06/202 for cough.  Last refilled 11/24/2018 for #60 with no refills.  CPE scheduled 04/27/2019.

## 2019-04-25 MED ORDER — NAPROXEN 500 MG PO TABS: ORAL_TABLET | ORAL | 0 refills | Status: DC

## 2019-04-27 ENCOUNTER — Encounter: Payer: Self-pay | Admitting: Internal Medicine

## 2019-04-27 ENCOUNTER — Ambulatory Visit (INDEPENDENT_AMBULATORY_CARE_PROVIDER_SITE_OTHER): Payer: Commercial Managed Care - PPO | Admitting: Internal Medicine

## 2019-04-27 ENCOUNTER — Other Ambulatory Visit: Payer: Self-pay

## 2019-04-27 VITALS — BP 124/80 | HR 60 | Temp 97.9°F | Ht 65.25 in | Wt 315.2 lb

## 2019-04-27 DIAGNOSIS — I10 Essential (primary) hypertension: Secondary | ICD-10-CM

## 2019-04-27 DIAGNOSIS — Z0000 Encounter for general adult medical examination without abnormal findings: Secondary | ICD-10-CM

## 2019-04-27 DIAGNOSIS — Z1211 Encounter for screening for malignant neoplasm of colon: Secondary | ICD-10-CM | POA: Diagnosis not present

## 2019-04-27 DIAGNOSIS — J4520 Mild intermittent asthma, uncomplicated: Secondary | ICD-10-CM | POA: Diagnosis not present

## 2019-04-27 LAB — COMPREHENSIVE METABOLIC PANEL
ALT: 36 U/L — ABNORMAL HIGH (ref 0–35)
AST: 22 U/L (ref 0–37)
Albumin: 3.9 g/dL (ref 3.5–5.2)
Alkaline Phosphatase: 53 U/L (ref 39–117)
BUN: 18 mg/dL (ref 6–23)
CO2: 29 mEq/L (ref 19–32)
Calcium: 9.5 mg/dL (ref 8.4–10.5)
Chloride: 109 mEq/L (ref 96–112)
Creatinine, Ser: 0.75 mg/dL (ref 0.40–1.20)
GFR: 98.91 mL/min (ref >60.00)
Glucose, Bld: 113 mg/dL — ABNORMAL HIGH (ref 70–99)
Potassium: 5.3 mEq/L — ABNORMAL HIGH (ref 3.5–5.1)
Sodium: 144 mEq/L (ref 135–145)
Total Bilirubin: 0.3 mg/dL (ref 0.2–1.2)
Total Protein: 7 g/dL (ref 6.0–8.3)

## 2019-04-27 LAB — LIPID PANEL
Cholesterol: 253 mg/dL — ABNORMAL HIGH (ref 0–200)
HDL: 53.3 mg/dL (ref >39.00)
LDL Cholesterol: 179 mg/dL — ABNORMAL HIGH (ref 0–99)
NonHDL: 199.94
Total CHOL/HDL Ratio: 5
Triglycerides: 107 mg/dL (ref 0.0–149.0)
VLDL: 21.4 mg/dL (ref 0.0–40.0)

## 2019-04-27 LAB — CBC
HCT: 40.1 % (ref 36.0–46.0)
Hemoglobin: 12.7 g/dL (ref 12.0–15.0)
MCHC: 31.8 g/dL (ref 30.0–36.0)
MCV: 83.8 fl (ref 78.0–100.0)
Platelets: 347 10*3/uL (ref 150.0–400.0)
RBC: 4.78 Mil/uL (ref 3.87–5.11)
RDW: 14 % (ref 11.5–15.5)
WBC: 9.1 10*3/uL (ref 4.0–10.5)

## 2019-04-27 LAB — T4, FREE: Free T4: 0.77 ng/dL (ref 0.60–1.60)

## 2019-04-27 MED ORDER — LISINOPRIL-HYDROCHLOROTHIAZIDE 20-25 MG PO TABS: 1.0000 | ORAL_TABLET | Freq: Every day | ORAL | 3 refills | Status: DC

## 2019-04-27 NOTE — Assessment & Plan Note (Signed)
Doing well lately 

## 2019-04-27 NOTE — Assessment & Plan Note (Signed)
BP Readings from Last 3 Encounters:  04/27/19 124/80  01/05/18 126/84  07/09/17 128/80   Good control

## 2019-04-27 NOTE — Assessment & Plan Note (Signed)
Healthy but needs to work on fitness Due for mammogram No pap due to hyster FIT Will take COVID vaccine but still prefers no flu vaccine

## 2019-04-27 NOTE — Progress Notes (Signed)
Subjective:    Patient ID: Lindsey Walls Seen, female    DOB: [birth year], 49 y.o.   MRN: [id number]  HPI Here for physical  This visit occurred during the SARS-CoV-2 public health emergency.  Safety protocols were in place, including screening questions prior to the visit, additional usage of staff PPE, and extensive cleaning of exam room while observing appropriate contact time as indicated for disinfecting solutions.   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 Working from home  [place] her weight is up Pandemic affecting Trying to walk more  Asthma is quiet now Hasn't needed albuterol in some time  Current Outpatient Medications on File Prior to Visit  Medication Sig Dispense Refill  . albuterol (VENTOLIN HFA) 108 (90 Base) MCG/ACT inhaler inhale 2 puffs by mouth every 6 hours if needed 18 g 1  . lisinopril-hydrochlorothiazide (ZESTORETIC) 20-25 MG tablet TAKE 1 TABLET BY MOUTH DAILY 30 tablet 2  . MISC NATURAL PRODUCTS PO Take by mouth. CBD oil    . naproxen (NAPROSYN) 500 MG tablet TAKE 1 TABLET(500 MG) BY MOUTH TWICE DAILY AS NEEDED 60 tablet 0   No current facility-administered medications on file prior to visit.    No Known Allergies  Past Medical History:  Diagnosis Date  . Anemia   . Bronchitis, chronic ([place])   . Chronic back pain   . Fibroid   . Hyperlipidemia   . Hypertension   . Obesity   . Obesity     Past Surgical History:  Procedure Laterality Date  . ABDOMINAL HYSTERECTOMY N/A 11/23/2012   Procedure: HYSTERECTOMY ABDOMINAL with repair of laproscopic torcar incision; lysis of adhesions ;  Surgeon: Daria Pastures, MD;  Location: [facility];  Service: Gynecology;  Laterality: N/A;  . CESAREAN SECTION    . CHOLECYSTECTOMY    . CYSTOSCOPY N/A 11/23/2012   Procedure: CYSTOSCOPY;  Surgeon: Daria Pastures, MD;  Location: [facility];  Service: Gynecology;  Laterality: N/A;  .  ENDOMETRIAL ABLATION  08/08  . OMENTECTOMY N/A 11/23/2012   Procedure: OMENTECTOMY;  Surgeon: Daria Pastures, MD;  Location: [facility];  Service: Gynecology;  Laterality: N/A;  . ROBOTIC ASSISTED TOTAL HYSTERECTOMY N/A 11/23/2012   Procedure: ATTEMPTED ROBOTIC ASSISTED TOTAL HYSTERECTOMY;  Surgeon: Daria Pastures, MD;  Location: [facility];  Service: Gynecology;  Laterality: N/A;  . TIBIA FRACTURE SURGERY      Family History  Problem Relation Age of Onset  . Coronary artery disease Mother   . Hypertension Father   . Cancer Father        lung  . COPD Father   . Coronary artery disease Sister     Social History   Socioeconomic History  . Marital status: Married    Spouse name: Not on file  . Number of children: 2  . Years of education: Not on file  . Highest education level: Not on file  Occupational History  . Occupation: Leisure centre manager    Comment: [employer]  Tobacco Use  . Smoking status: Never Smoker  . Smokeless tobacco: Never Used  Substance and Sexual Activity  . Alcohol use: No    Alcohol/week: 0.0 standard drinks  . Drug use: No  . Sexual activity: Not on file  Other Topics Concern  . Not on file  Social History Narrative  . Not on file   Social Determinants of Health   Financial Resource Strain:   . Difficulty  of Paying Living Expenses: Not on file  Food Insecurity:   . Worried About Charity fundraiser in the Last Year: Not on file  . Ran Out of Food in the Last Year: Not on file  Transportation Needs:   . Lack of Transportation (Medical): Not on file  . Lack of Transportation (Non-Medical): Not on file  Physical Activity:   . Days of Exercise per Week: Not on file  . Minutes of Exercise per Session: Not on file  Stress:   . Feeling of Stress : Not on file  Social Connections:   . Frequency of Communication with Friends and Family: Not on file  . Frequency of Social Gatherings with Friends and Family: Not on file  . Attends Religious Services: Not on  file  . Active Member of Clubs or Organizations: Not on file  . Attends Archivist Meetings: Not on file  . Marital Status: Not on file  Intimate Partner Violence:   . Fear of Current or Ex-Partner: Not on file  . Emotionally Abused: Not on file  . Physically Abused: Not on file  . Sexually Abused: Not on file   Review of Systems  Constitutional: Positive for unexpected weight change. Negative for fatigue.       Wears seat belt  HENT: Negative for dental problem, hearing loss and tinnitus.        Keeps up with dentist  Eyes: Negative for visual disturbance.       Non diplopia or unilateral vision loss  Respiratory: Negative for cough, chest tightness, shortness of breath and wheezing.   Cardiovascular: Negative for chest pain and leg swelling.       Rare palpitations with stress  Gastrointestinal: Negative for abdominal pain, blood in stool and constipation.       No heartburn  Endocrine: Negative for polydipsia and polyuria.  Genitourinary: Negative for dyspareunia, dysuria and hematuria.       Mild hormonal symptoms  Musculoskeletal: Positive for arthralgias and back pain. Negative for joint swelling.       CBD oil is helping aches and pains. Rarely needs the aleve now  Skin:       Melasma on cheeks--discussed lightening creams  Allergic/Immunologic: Negative for environmental allergies and immunocompromised state.  Neurological: Negative for dizziness, syncope, light-headedness and headaches.  Hematological: Negative for adenopathy. Does not bruise/bleed easily.       Doesn't heal as quickly  Psychiatric/Behavioral: Negative for sleep disturbance.       Grieving now--mild depression       Objective:   Physical Exam  Constitutional: She is oriented to person, place, and time. She appears well-developed. No distress.  HENT:  Head: Normocephalic and atraumatic.  Right Ear: External ear normal.  Left Ear: External ear normal.  Mouth/Throat: Oropharynx is clear  and moist. No oropharyngeal exudate.  Eyes: Pupils are equal, round, and reactive to light. Conjunctivae are normal.  Neck: No thyromegaly present.  Cardiovascular: Normal rate, regular rhythm, normal heart sounds and intact distal pulses. Exam reveals no gallop.  No murmur heard. Respiratory: Effort normal and breath sounds normal. No respiratory distress. She has no wheezes. She has no rales.  GI: Soft. There is no abdominal tenderness.  Musculoskeletal:        General: No tenderness or edema.  Lymphadenopathy:    She has no cervical adenopathy.  Neurological: She is alert and oriented to person, place, and time.  Skin: No rash noted. No erythema.  Psychiatric: She has  a normal mood and affect. Her behavior is normal.           Assessment & Plan:

## 2019-04-27 NOTE — Patient Instructions (Signed)
Please set up your screening mammogram. 

## 2019-07-09 ENCOUNTER — Ambulatory Visit: Payer: Commercial Managed Care - PPO | Attending: Internal Medicine

## 2019-07-09 DIAGNOSIS — Z23 Encounter for immunization: Secondary | ICD-10-CM

## 2019-07-09 NOTE — Progress Notes (Signed)
   Covid-19 Vaccination Clinic  Name:  Lindsey Walls    MRN: [id number] DOB: 07-11-1970  07/09/2019  [name] was observed post Covid-19 immunization for 15 minutes without incident. She was provided with Vaccine Information Sheet and instruction to access the V-Safe system.   [name] was instructed to call 911 with any severe reactions post vaccine: Marland Kitchen Difficulty breathing  . Swelling of face and throat  . A fast heartbeat  . A bad rash all over body  . Dizziness and weakness   Immunizations Administered    Name Date Dose VIS Date Route   Pfizer COVID-19 Vaccine 07/09/2019  8:33 AM 0.3 mL 03/19/2019 Intramuscular   Manufacturer: [place]   Lot: [id number]   [place]: [id number]

## 2019-07-12 ENCOUNTER — Telehealth: Payer: Commercial Managed Care - PPO | Admitting: Family

## 2019-07-12 DIAGNOSIS — M545 Low back pain, unspecified: Secondary | ICD-10-CM

## 2019-07-12 MED ORDER — NAPROXEN 500 MG PO TABS: 500.0000 mg | ORAL_TABLET | Freq: Two times a day (BID) | ORAL | 0 refills | Status: DC

## 2019-07-12 MED ORDER — BACLOFEN 10 MG PO TABS: 10.0000 mg | ORAL_TABLET | Freq: Three times a day (TID) | ORAL | 0 refills | Status: DC

## 2019-07-12 NOTE — Progress Notes (Signed)

## 2019-08-10 ENCOUNTER — Ambulatory Visit: Payer: Commercial Managed Care - PPO | Attending: Internal Medicine

## 2019-08-10 DIAGNOSIS — Z23 Encounter for immunization: Secondary | ICD-10-CM

## 2019-08-10 NOTE — Progress Notes (Signed)
   Covid-19 Vaccination Clinic  Name:  Lindsey Walls    MRN: [id number] DOB: 16-Nov-1970  08/10/2019  Lindsey Walls was observed post Covid-19 immunization for 15 minutes without incident. She was provided with Vaccine Information Sheet and instruction to access the V-Safe system.   Lindsey Walls was instructed to call 911 with any severe reactions post vaccine: Marland Kitchen Difficulty breathing  . Swelling of face and throat  . A fast heartbeat  . A bad rash all over body  . Dizziness and weakness   Immunizations Administered    Name Date Dose VIS Date Route   Pfizer COVID-19 Vaccine 08/10/2019  8:07 AM 0.3 mL 06/02/2018 Intramuscular   Manufacturer: [place]   Lot: [id number]   [place]: [id number]

## 2019-08-12 ENCOUNTER — Other Ambulatory Visit: Payer: Self-pay | Admitting: Internal Medicine

## 2019-08-12 DIAGNOSIS — M545 Low back pain, unspecified: Secondary | ICD-10-CM

## 2019-10-12 ENCOUNTER — Other Ambulatory Visit: Payer: Self-pay | Admitting: *Deleted

## 2019-10-12 ENCOUNTER — Other Ambulatory Visit: Payer: Self-pay | Admitting: Internal Medicine

## 2019-10-12 MED ORDER — HYDROCODONE-HOMATROPINE 5-1.5 MG/5ML PO SYRP: 5.0000 mL | ORAL_SOLUTION | Freq: Three times a day (TID) | ORAL | 0 refills | Status: DC | PRN

## 2019-10-12 MED ORDER — ALBUTEROL SULFATE HFA 108 (90 BASE) MCG/ACT IN AERS: INHALATION_SPRAY | RESPIRATORY_TRACT | 0 refills | Status: DC

## 2019-10-12 NOTE — Telephone Encounter (Signed)
Patient left a voicemail stating that she is trying to get a refill on her Albuterol inhaler. Patient stated that the last time she had it refilled was 2019. Patient stated that she was advised to contact her physician for the refill. Pharmacy [facility]

## 2019-10-12 NOTE — Telephone Encounter (Signed)
Rx sent electronically.  

## 2019-10-30 ENCOUNTER — Other Ambulatory Visit: Payer: Self-pay | Admitting: Internal Medicine

## 2019-10-30 DIAGNOSIS — M545 Low back pain, unspecified: Secondary | ICD-10-CM

## 2019-11-22 ENCOUNTER — Other Ambulatory Visit: Payer: Self-pay | Admitting: Family

## 2019-11-22 ENCOUNTER — Other Ambulatory Visit: Payer: Self-pay | Admitting: Internal Medicine

## 2019-11-22 DIAGNOSIS — M545 Low back pain, unspecified: Secondary | ICD-10-CM

## 2019-11-23 MED ORDER — BACLOFEN 10 MG PO TABS: 10.0000 mg | ORAL_TABLET | Freq: Three times a day (TID) | ORAL | 0 refills | Status: DC | PRN

## 2019-11-23 NOTE — Telephone Encounter (Signed)
Pt also request refill of Baclofen from April 2021 Evisit--- please advise

## 2019-12-23 ENCOUNTER — Ambulatory Visit (INDEPENDENT_AMBULATORY_CARE_PROVIDER_SITE_OTHER): Payer: Commercial Managed Care - PPO | Admitting: Internal Medicine

## 2019-12-23 ENCOUNTER — Other Ambulatory Visit: Payer: Self-pay

## 2019-12-23 ENCOUNTER — Encounter: Payer: Self-pay | Admitting: Internal Medicine

## 2019-12-23 VITALS — BP 102/80 | HR 65 | Temp 97.8°F | Ht 65.25 in | Wt 314.0 lb

## 2019-12-23 DIAGNOSIS — I10 Essential (primary) hypertension: Secondary | ICD-10-CM

## 2019-12-23 DIAGNOSIS — M1000 Idiopathic gout, unspecified site: Secondary | ICD-10-CM

## 2019-12-23 DIAGNOSIS — M109 Gout, unspecified: Secondary | ICD-10-CM | POA: Insufficient documentation

## 2019-12-23 LAB — RENAL FUNCTION PANEL
Albumin: 3.9 g/dL (ref 3.5–5.2)
BUN: 16 mg/dL (ref 6–23)
CO2: 31 mEq/L (ref 19–32)
Calcium: 8.9 mg/dL (ref 8.4–10.5)
Chloride: 102 mEq/L (ref 96–112)
Creatinine, Ser: 0.77 mg/dL (ref 0.40–1.20)
GFR: 95.7 mL/min (ref >60.00)
Glucose, Bld: 101 mg/dL — ABNORMAL HIGH (ref 70–99)
Phosphorus: 3.3 mg/dL (ref 2.3–4.6)
Potassium: 3.6 mEq/L (ref 3.5–5.1)
Sodium: 140 mEq/L (ref 135–145)

## 2019-12-23 LAB — URIC ACID: Uric Acid, Serum: 6 mg/dL (ref 2.4–7.0)

## 2019-12-23 MED ORDER — COLCHICINE 0.6 MG PO TABS
0.6000 mg | ORAL_TABLET | Freq: Two times a day (BID) | ORAL | 1 refills | Status: DC | PRN
Start: 2019-12-23 — End: 2019-12-24

## 2019-12-23 NOTE — Patient Instructions (Signed)

## 2019-12-23 NOTE — Progress Notes (Signed)
Subjective:    Patient ID: Lindsey Walls, female    DOB: Aug 11, 1970, 49 y.o.   MRN: [id number]  HPI  Here due to toe pain This visit occurred during the SARS-CoV-2 public health emergency.  Safety protocols were in place, including screening questions prior to the visit, additional usage of staff PPE, and extensive cleaning of exam room while observing appropriate contact time as indicated for disinfecting solutions.   Started 3 days ago---all of a sudden No injury Left 1st MTP Had salmon on Monday and then shrimp the next day  Pain was severe Tried aleve--- 2 bid and 1 in evening Better now  Current Outpatient Medications on File Prior to Visit  Medication Sig Dispense Refill  . albuterol (VENTOLIN HFA) 108 (90 Base) MCG/ACT inhaler inhale 2 puffs by mouth every 6 hours if needed 18 g 0  . baclofen (LIORESAL) 10 MG tablet Take 1 tablet (10 mg total) by mouth 3 (three) times daily as needed for muscle spasms. 30 each 0  . lisinopril-hydrochlorothiazide (ZESTORETIC) 20-25 MG tablet Take 1 tablet by mouth daily. 90 tablet 3  . MISC NATURAL PRODUCTS PO Take by mouth. CBD oil    . naproxen (NAPROSYN) 500 MG tablet TAKE 1 TABLET(500 MG) BY MOUTH TWICE DAILY WITH A MEAL 60 tablet 1   No current facility-administered medications on file prior to visit.    No Known Allergies  Past Medical History:  Diagnosis Date  . Anemia   . Bronchitis, chronic ([place])   . Chronic back pain   . Fibroid   . Hyperlipidemia   . Hypertension   . Obesity   . Obesity     Past Surgical History:  Procedure Laterality Date  . ABDOMINAL HYSTERECTOMY N/A 11/23/2012   Procedure: HYSTERECTOMY ABDOMINAL with repair of laproscopic torcar incision; lysis of adhesions ;  Surgeon: Daria Pastures, MD;  Location: [facility];  Service: Gynecology;  Laterality: N/A;  . CESAREAN SECTION    . CHOLECYSTECTOMY    . CYSTOSCOPY N/A 11/23/2012   Procedure: CYSTOSCOPY;  Surgeon: Daria Pastures, MD;  Location: [facility];  Service: Gynecology;  Laterality: N/A;  . ENDOMETRIAL ABLATION  08/08  . OMENTECTOMY N/A 11/23/2012   Procedure: OMENTECTOMY;  Surgeon: Daria Pastures, MD;  Location: [facility];  Service: Gynecology;  Laterality: N/A;  . ROBOTIC ASSISTED TOTAL HYSTERECTOMY N/A 11/23/2012   Procedure: ATTEMPTED ROBOTIC ASSISTED TOTAL HYSTERECTOMY;  Surgeon: Daria Pastures, MD;  Location: [facility];  Service: Gynecology;  Laterality: N/A;  . TIBIA FRACTURE SURGERY      Family History  Problem Relation Age of Onset  . Coronary artery disease Mother   . Hypertension Father   . Cancer Father        lung  . COPD Father   . Coronary artery disease Sister     Social History   Socioeconomic History  . Marital status: Married    Spouse name: Not on file  . Number of children: 2  . Years of education: Not on file  . Highest education level: Not on file  Occupational History  . Occupation: Leisure centre manager    Comment: [employer]  Tobacco Use  . Smoking status: Never Smoker  . Smokeless tobacco: Never Used  Substance and Sexual Activity  . Alcohol use: No    Alcohol/week: 0.0 standard drinks  . Drug use: No  . Sexual activity: Not on file  Other Topics Concern  . Not on file  Social History Narrative  .  Not on file   Social Determinants of Health   Financial Resource Strain:   . Difficulty of Paying Living Expenses: Not on file  Food Insecurity:   . Worried About Charity fundraiser in the Last Year: Not on file  . Ran Out of Food in the Last Year: Not on file  Transportation Needs:   . Lack of Transportation (Medical): Not on file  . Lack of Transportation (Non-Medical): Not on file  Physical Activity:   . Days of Exercise per Week: Not on file  . Minutes of Exercise per Session: Not on file  Stress:   . Feeling of Stress : Not on file  Social Connections:   . Frequency of Communication with Friends and Family: Not on file  . Frequency of Social Gatherings with Friends and Family:  Not on file  . Attends Religious Services: Not on file  . Active Member of Clubs or Organizations: Not on file  . Attends Archivist Meetings: Not on file  . Marital Status: Not on file  Intimate Partner Violence:   . Fear of Current or Ex-Partner: Not on file  . Emotionally Abused: Not on file  . Physically Abused: Not on file  . Sexually Abused: Not on file   Review of Systems  No fever      Objective:   Physical Exam Constitutional:      Appearance: Normal appearance.  Musculoskeletal:     Comments: Mild tenderness in classic spot on left 1st MTP  Neurological:     Mental Status: She is alert.            Assessment & Plan:

## 2019-12-23 NOTE — Assessment & Plan Note (Signed)
BP Readings from Last 3 Encounters:  12/23/19 102/80  04/27/19 124/80  01/05/18 126/84   Good control Will stop the HCTZ if she has recurrent gout symptoms

## 2019-12-23 NOTE — Assessment & Plan Note (Signed)
Fairly classic presentation Aleve has mostly taken care of it Will check uric acid level---will almost certainly clarify diagnosis if high Diet information

## 2019-12-24 MED ORDER — COLCHICINE 0.6 MG PO TABS: 0.6000 mg | ORAL_TABLET | Freq: Two times a day (BID) | ORAL | 1 refills | Status: DC | PRN

## 2019-12-24 NOTE — Addendum Note (Signed)
Addended by: Pilar Grammes on: 12/24/2019 10:26 AM   Modules accepted: Orders

## 2020-01-07 ENCOUNTER — Other Ambulatory Visit: Payer: Self-pay | Admitting: Internal Medicine

## 2020-01-07 DIAGNOSIS — M545 Low back pain, unspecified: Secondary | ICD-10-CM

## 2020-01-17 ENCOUNTER — Telehealth: Payer: Self-pay

## 2020-01-17 NOTE — Telephone Encounter (Signed)
Pt said for 2 wks having lt knee uncomfortableness,difficult bending lt knee and worsening; no known injury; aleve helps with discomfort but not ability to bend the knee., pt has in office appt with Dr Diona Browner on 01/18/20 at 10:40. No covid symptoms. UC precautions given and pt voiced understanding.

## 2020-01-17 NOTE — Telephone Encounter (Signed)
Noted  

## 2020-01-18 ENCOUNTER — Other Ambulatory Visit: Payer: Self-pay

## 2020-01-18 ENCOUNTER — Ambulatory Visit (INDEPENDENT_AMBULATORY_CARE_PROVIDER_SITE_OTHER): Payer: Commercial Managed Care - PPO | Admitting: Family Medicine

## 2020-01-18 ENCOUNTER — Encounter: Payer: Self-pay | Admitting: Family Medicine

## 2020-01-18 VITALS — BP 120/72 | HR 59 | Temp 98.1°F | Ht 65.25 in | Wt 316.5 lb

## 2020-01-18 DIAGNOSIS — M25562 Pain in left knee: Secondary | ICD-10-CM | POA: Diagnosis not present

## 2020-01-18 MED ORDER — DICLOFENAC SODIUM 75 MG PO TBEC: 75.0000 mg | DELAYED_RELEASE_TABLET | Freq: Two times a day (BID) | ORAL | 0 refills | Status: DC

## 2020-01-18 NOTE — Patient Instructions (Addendum)
Stop aleve .  Start diclofenac  twice daily.  Start home PT for knee pain.  Start OTC glucosamine 500 mg 1-3 times daily,   Consider turmeric or tart cherry.  If not improving in 2 weeks follow up.

## 2020-01-18 NOTE — Assessment & Plan Note (Signed)
OA vs meniscal tear.  Start with diclofenac and home PT. Ice and elevation.  Pt info given.  Can try glucosamine, tumeric and tart cherry... for natural treatment of OA. Follow up in 2 weeks for re-eval if not improving as expected.

## 2020-01-18 NOTE — Progress Notes (Signed)
Chief Complaint  Patient presents with  . Knee Pain    Left    History of Present Illness: HPI    87 year  Morbidly obese female presents with new onset left knee stiffness and discomfort x several months.  Worse in last 3 weeks.  no known injury.. she is less active in last year.. sitting more until several months ago trying to walk around table in house. Soreness anteriorly and at night posteriorly.   By end of day knee is swollen, stiff.  Difficulty with bending.  occ feeling like giving way.. n clicking or popping.  No redness. No swelling visible.   Using aleve BID for knee.. helps some.  Has started some home PT strengthening   No past knee issues in left knee.   This visit occurred during the SARS-CoV-2 public health emergency.  Safety protocols were in place, including screening questions prior to the visit, additional usage of staff PPE, and extensive cleaning of exam room while observing appropriate contact time as indicated for disinfecting solutions.   COVID 19 screen:  No recent travel or known exposure to COVID19 The patient denies respiratory symptoms of COVID 19 at this time. The importance of social distancing was discussed today.     Review of Systems  Constitutional: Negative for chills and fever.  HENT: Negative for congestion and ear pain.   Eyes: Negative for pain and redness.  Respiratory: Negative for cough and shortness of breath.   Cardiovascular: Negative for chest pain, palpitations and leg swelling.  Gastrointestinal: Negative for abdominal pain, blood in stool, constipation, diarrhea, nausea and vomiting.  Genitourinary: Negative for dysuria.  Musculoskeletal: Negative for falls and myalgias.  Skin: Negative for rash.  Neurological: Negative for dizziness.  Psychiatric/Behavioral: Negative for depression. The patient is not nervous/anxious.       Past Medical History:  Diagnosis Date  . Anemia   . Bronchitis, chronic ([place])   . Chronic  back pain   . Fibroid   . Hyperlipidemia   . Hypertension   . Obesity   . Obesity     reports that she has never smoked. She has never used smokeless tobacco. She reports that she does not drink alcohol and does not use drugs.   Current Outpatient Medications:  .  albuterol (VENTOLIN HFA) 108 (90 Base) MCG/ACT inhaler, inhale 2 puffs by mouth every 6 hours if needed, Disp: 18 g, Rfl: 0 .  baclofen (LIORESAL) 10 MG tablet, Take 1 tablet (10 mg total) by mouth 3 (three) times daily as needed for muscle spasms., Disp: 30 each, Rfl: 0 .  colchicine 0.6 MG tablet, Take 1 tablet (0.6 mg total) by mouth 2 (two) times daily as needed. For gout attack, Disp: 60 tablet, Rfl: 1 .  lisinopril-hydrochlorothiazide (ZESTORETIC) 20-25 MG tablet, Take 1 tablet by mouth daily., Disp: 90 tablet, Rfl: 3 .  MISC NATURAL PRODUCTS PO, Take by mouth. CBD oil, Disp: , Rfl:  .  naproxen (NAPROSYN) 500 MG tablet, TAKE 1 TABLET(500 MG) BY MOUTH TWICE DAILY WITH A MEAL, Disp: 60 tablet, Rfl: 2   Observations/Objective: Blood pressure 120/72, pulse (!) 59, temperature 98.1 F (36.7 C), temperature source Temporal, height 5' 5.25" (1.657 m), weight (!) 316 lb 8 oz (143.6 kg), last menstrual period 10/12/2012, SpO2 96 %.  Physical Exam Constitutional:      General: She is not in acute distress.    Appearance: Normal appearance. She is well-developed. She is obese. She is not ill-appearing or toxic-appearing.  HENT:     Head: Normocephalic.     Right Ear: Hearing, tympanic membrane, ear canal and external ear normal. Tympanic membrane is not erythematous, retracted or bulging.     Left Ear: Hearing, tympanic membrane, ear canal and external ear normal. Tympanic membrane is not erythematous, retracted or bulging.     Nose: No mucosal edema or rhinorrhea.     Right Sinus: No maxillary sinus tenderness or frontal sinus tenderness.     Left Sinus: No maxillary sinus tenderness or frontal sinus tenderness.      Mouth/Throat:     Pharynx: Uvula midline.  Eyes:     General: Lids are normal. Lids are everted, no foreign bodies appreciated.     Conjunctiva/sclera: Conjunctivae normal.     Pupils: Pupils are equal, round, and reactive to light.  Neck:     Thyroid: No thyroid mass or thyromegaly.     Vascular: No carotid bruit.     Trachea: Trachea normal.  Cardiovascular:     Rate and Rhythm: Normal rate and regular rhythm.     Pulses: Normal pulses.     Heart sounds: Normal heart sounds, S1 normal and S2 normal. No murmur heard.  No friction rub. No gallop.   Pulmonary:     Effort: Pulmonary effort is normal. No tachypnea or respiratory distress.     Breath sounds: Normal breath sounds. No decreased breath sounds, wheezing, rhonchi or rales.  Abdominal:     General: Bowel sounds are normal.     Palpations: Abdomen is soft.     Tenderness: There is no abdominal tenderness.  Musculoskeletal:     Cervical back: Normal range of motion and neck supple.     Right knee: Normal.     Left knee: Swelling present. No deformity, effusion, erythema, lacerations, bony tenderness or crepitus. Decreased range of motion. Tenderness present over the medial joint line and lateral joint line. No MCL, LCL, ACL, PCL or patellar tendon tenderness. Abnormal meniscus. Normal alignment and normal patellar mobility.     Instability Tests: Anterior drawer test negative. Posterior drawer test negative. Anterior Lachman test negative. Medial McMurray test positive and lateral McMurray test positive.  Skin:    General: Skin is warm and dry.     Findings: No rash.  Neurological:     Mental Status: She is alert.  Psychiatric:        Mood and Affect: Mood is not anxious or depressed.        Speech: Speech normal.        Behavior: Behavior normal. Behavior is cooperative.        Thought Content: Thought content normal.        Judgment: Judgment normal.      Assessment and Plan   Acute pain of left knee  OA vs meniscal  tear.  Start with diclofenac and home PT. Ice and elevation.  Pt info given.  Can try glucosamine, tumeric and tart cherry... for natural treatment of OA. Follow up in 2 weeks for re-eval if not improving as expected.     Eliezer Lofts, MD

## 2020-01-19 ENCOUNTER — Other Ambulatory Visit: Payer: Self-pay | Admitting: Family Medicine

## 2020-01-19 ENCOUNTER — Ambulatory Visit: Payer: Commercial Managed Care - PPO | Admitting: Family Medicine

## 2020-01-26 MED ORDER — LISINOPRIL 40 MG PO TABS
40.0000 mg | ORAL_TABLET | Freq: Every day | ORAL | 3 refills | Status: DC
Start: 2020-01-26 — End: 2020-05-02

## 2020-03-18 ENCOUNTER — Other Ambulatory Visit: Payer: Self-pay | Admitting: Internal Medicine

## 2020-04-10 ENCOUNTER — Other Ambulatory Visit: Payer: Self-pay | Admitting: Family Medicine

## 2020-04-10 ENCOUNTER — Other Ambulatory Visit: Payer: Self-pay | Admitting: Internal Medicine

## 2020-04-10 DIAGNOSIS — M545 Low back pain, unspecified: Secondary | ICD-10-CM

## 2020-04-10 NOTE — Telephone Encounter (Signed)
Last office visit 01/18/2020 with Dr. Ermalene Searing for acute pain of left knee.  Last refilled 01/18/2020 for #30 with no refills.  CPE scheduled with Dr. Alphonsus Sias 05/02/2020.

## 2020-04-11 ENCOUNTER — Telehealth: Payer: Self-pay | Admitting: *Deleted

## 2020-04-11 NOTE — Telephone Encounter (Signed)
Patient left a voicemail requesting a refill on the medication for her arthritis pain. Called patient back to get more information and got her voicemail. Left a message for patient to call back. Need to know the name of the medication and pharmacy.

## 2020-04-14 NOTE — Telephone Encounter (Signed)
Attempted to call pt to follow up. There was no answer and no option for me to leave a message.

## 2020-04-20 NOTE — Telephone Encounter (Signed)
Spoke to pt. She said it was the diclofenac. She said she got a refill of it and is doing good.

## 2020-05-02 ENCOUNTER — Other Ambulatory Visit: Payer: Self-pay

## 2020-05-02 ENCOUNTER — Other Ambulatory Visit: Payer: Self-pay | Admitting: Internal Medicine

## 2020-05-02 ENCOUNTER — Ambulatory Visit (INDEPENDENT_AMBULATORY_CARE_PROVIDER_SITE_OTHER): Payer: 59 | Admitting: Internal Medicine

## 2020-05-02 ENCOUNTER — Encounter: Payer: Self-pay | Admitting: Internal Medicine

## 2020-05-02 VITALS — BP 122/80 | HR 72 | Temp 97.6°F | Ht 66.0 in | Wt 311.0 lb

## 2020-05-02 DIAGNOSIS — I10 Essential (primary) hypertension: Secondary | ICD-10-CM

## 2020-05-02 DIAGNOSIS — J4520 Mild intermittent asthma, uncomplicated: Secondary | ICD-10-CM

## 2020-05-02 DIAGNOSIS — Z1211 Encounter for screening for malignant neoplasm of colon: Secondary | ICD-10-CM

## 2020-05-02 DIAGNOSIS — Z0000 Encounter for general adult medical examination without abnormal findings: Secondary | ICD-10-CM | POA: Diagnosis not present

## 2020-05-02 DIAGNOSIS — Z1231 Encounter for screening mammogram for malignant neoplasm of breast: Secondary | ICD-10-CM

## 2020-05-02 LAB — LIPID PANEL
Cholesterol: 265 mg/dL — ABNORMAL HIGH (ref 0–200)
HDL: 51.7 mg/dL (ref >39.00)
LDL Cholesterol: 184 mg/dL — ABNORMAL HIGH (ref 0–99)
NonHDL: 212.98
Total CHOL/HDL Ratio: 5
Triglycerides: 147 mg/dL (ref 0.0–149.0)
VLDL: 29.4 mg/dL (ref 0.0–40.0)

## 2020-05-02 LAB — COMPREHENSIVE METABOLIC PANEL
ALT: 47 U/L — ABNORMAL HIGH (ref 0–35)
AST: 24 U/L (ref 0–37)
Albumin: 4.1 g/dL (ref 3.5–5.2)
Alkaline Phosphatase: 55 U/L (ref 39–117)
BUN: 16 mg/dL (ref 6–23)
CO2: 32 mEq/L (ref 19–32)
Calcium: 9.6 mg/dL (ref 8.4–10.5)
Chloride: 103 mEq/L (ref 96–112)
Creatinine, Ser: 0.76 mg/dL (ref 0.40–1.20)
GFR: 90.89 mL/min (ref >60.00)
Glucose, Bld: 122 mg/dL — ABNORMAL HIGH (ref 70–99)
Potassium: 4.5 mEq/L (ref 3.5–5.1)
Sodium: 140 mEq/L (ref 135–145)
Total Bilirubin: 0.5 mg/dL (ref 0.2–1.2)
Total Protein: 7.2 g/dL (ref 6.0–8.3)

## 2020-05-02 LAB — CBC
HCT: 39.5 % (ref 36.0–46.0)
Hemoglobin: 12.7 g/dL (ref 12.0–15.0)
MCHC: 32.2 g/dL (ref 30.0–36.0)
MCV: 82.9 fl (ref 78.0–100.0)
Platelets: 330 10*3/uL (ref 150.0–400.0)
RBC: 4.76 Mil/uL (ref 3.87–5.11)
RDW: 13.9 % (ref 11.5–15.5)
WBC: 8.5 10*3/uL (ref 4.0–10.5)

## 2020-05-02 LAB — HEMOGLOBIN A1C: Hgb A1c MFr Bld: 6.9 % — ABNORMAL HIGH (ref 4.6–6.5)

## 2020-05-02 LAB — T4, FREE: Free T4: 0.71 ng/dL (ref 0.60–1.60)

## 2020-05-02 MED ORDER — SEMAGLUTIDE 7 MG PO TABS: 7.0000 mg | ORAL_TABLET | Freq: Every day | ORAL | 11 refills | Status: DC

## 2020-05-02 MED ORDER — SEMAGLUTIDE 3 MG PO TABS: 3.0000 mg | ORAL_TABLET | Freq: Every day | ORAL | 0 refills | Status: DC

## 2020-05-02 NOTE — Assessment & Plan Note (Signed)
Okay on the lisinopril/HCTZ  BP Readings from Last 3 Encounters:  05/02/20 122/80  01/18/20 120/72  12/23/19 102/80

## 2020-05-02 NOTE — Assessment & Plan Note (Signed)
Discussed options  Would like to try semaglutide if affordable

## 2020-05-02 NOTE — Assessment & Plan Note (Signed)
Uses the albuterol prn

## 2020-05-02 NOTE — Assessment & Plan Note (Signed)
Healthy Needs to add aerobic exercise FIT--must do Set up mammogram! No pap due to hyster Needs COVID booster Prefers no flu vaccine Consider shingrix

## 2020-05-02 NOTE — Progress Notes (Signed)
Subjective:    Patient ID: Lindsey Walls, female    DOB: April 12, 1970, 50 y.o.   MRN: [id number]  HPI Here for physical This visit occurred during the SARS-CoV-2 public health emergency.  Safety protocols were in place, including screening questions prior to the visit, additional usage of staff PPE, and extensive cleaning of exam room while observing appropriate contact time as indicated for disinfecting solutions.   Was having anxiety attacks after dad died Also recently lost her niece Now feeling better  BP okay No gout recently---relates it to shrimp more than the HCTZ  New job is working out well Has severe "carpal tunnel"---wearing braces (but pain is mostly in thumbs) Better with braces while on the computer Has lost strength in hands---but this is some better with the bracing  Current Outpatient Medications on File Prior to Visit  Medication Sig Dispense Refill  . albuterol (VENTOLIN HFA) 108 (90 Base) MCG/ACT inhaler inhale 2 puffs by mouth every 6 hours if needed 18 g 0  . baclofen (LIORESAL) 10 MG tablet TAKE 1 TABLET(10 MG) BY MOUTH THREE TIMES DAILY AS NEEDED FOR MUSCLE SPASMS 30 tablet 0  . colchicine 0.6 MG tablet Take 1 tablet (0.6 mg total) by mouth 2 (two) times daily as needed. For gout attack 60 tablet 1  . diclofenac (VOLTAREN) 75 MG EC tablet TAKE 1 TABLET(75 MG) BY MOUTH TWICE DAILY 30 tablet 0  . lisinopril-hydrochlorothiazide (ZESTORETIC) 20-25 MG tablet 1 90 tablet 3  . MISC NATURAL PRODUCTS PO Take by mouth. CBD oil    . naproxen (NAPROSYN) 500 MG tablet TAKE 1 TABLET(500 MG) BY MOUTH TWICE DAILY WITH A MEAL 60 tablet 2   No current facility-administered medications on file prior to visit.    No Known Allergies  Past Medical History:  Diagnosis Date  . Anemia   . Bronchitis, chronic ([place])   . Chronic back pain   . Fibroid   . Hyperlipidemia   . Hypertension   . Obesity   . Obesity     Past Surgical History:  Procedure Laterality Date  .  ABDOMINAL HYSTERECTOMY N/A 11/23/2012   Procedure: HYSTERECTOMY ABDOMINAL with repair of laproscopic torcar incision; lysis of adhesions ;  Surgeon: Daria Pastures, MD;  Location: [facility];  Service: Gynecology;  Laterality: N/A;  . CESAREAN SECTION    . CHOLECYSTECTOMY    . CYSTOSCOPY N/A 11/23/2012   Procedure: CYSTOSCOPY;  Surgeon: Daria Pastures, MD;  Location: [facility];  Service: Gynecology;  Laterality: N/A;  . ENDOMETRIAL ABLATION  08/08  . OMENTECTOMY N/A 11/23/2012   Procedure: OMENTECTOMY;  Surgeon: Daria Pastures, MD;  Location: [facility];  Service: Gynecology;  Laterality: N/A;  . ROBOTIC ASSISTED TOTAL HYSTERECTOMY N/A 11/23/2012   Procedure: ATTEMPTED ROBOTIC ASSISTED TOTAL HYSTERECTOMY;  Surgeon: Daria Pastures, MD;  Location: [facility];  Service: Gynecology;  Laterality: N/A;  . TIBIA FRACTURE SURGERY      Family History  Problem Relation Age of Onset  . Coronary artery disease Mother   . Hypertension Father   . Cancer Father        lung  . COPD Father   . Coronary artery disease Sister     Social History   Socioeconomic History  . Marital status: Married    Spouse name: Not on file  . Number of children: 2  . Years of education: Not on file  . Highest education level: Not on file  Occupational History  . 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 Smoking status: Never Smoker  . Smokeless tobacco: Never Used  Substance and Sexual Activity  . Alcohol use: No    Alcohol/week: 0.0 standard drinks  . Drug use: No  . Sexual activity: Not on file  Other Topics Concern  . Not on file  Social History Narrative  . Not on file   Social Determinants of Health   Financial Resource Strain: Not on file  Food Insecurity: Not on file  Transportation Needs: Not on file  Physical Activity: Not on file  Stress: Not on file  Social Connections: Not on file  Intimate Partner Violence: Not on file   Review of Systems   Constitutional: Negative for fatigue.       Wears seat belt Weight down 4# since last year Now doing yoga 4 times a week  HENT: Negative for dental problem, hearing loss and tinnitus.        Keeps up with dentist  Eyes: Negative for visual disturbance.       No diplopia or unilateral vision loss  Respiratory: Positive for wheezing. Negative for cough, chest tightness and shortness of breath.        Uses the inhaler rarely---mostly if cold exposure  Cardiovascular: Negative for chest pain, palpitations and leg swelling.  Gastrointestinal: Negative for blood in stool and constipation.       No heartburn  Endocrine: Negative for polydipsia and polyuria.  Genitourinary: Negative for difficulty urinating, dyspareunia, dysuria and hematuria.  Musculoskeletal: Positive for arthralgias and back pain. Negative for joint swelling.       Pain better---uses the cyclobenzaprine prn Aleve for knee pain---hasn't needed lately  Skin: Negative for rash.  Allergic/Immunologic: Negative for environmental allergies and immunocompromised state.  Neurological: Negative for dizziness, syncope and light-headedness.       Occ stress headaches  Hematological: Negative for adenopathy. Does not bruise/bleed easily.  Psychiatric/Behavioral: Negative for sleep disturbance.       Panic attacks now better Some grieving for niece       Objective:   Physical Exam Constitutional:      Appearance: She is obese.  HENT:     Right Ear: Tympanic membrane, ear canal and external ear normal.     Left Ear: Tympanic membrane, ear canal and external ear normal.     Mouth/Throat:     Pharynx: No oropharyngeal exudate or posterior oropharyngeal erythema.  Eyes:     Conjunctiva/sclera: Conjunctivae normal.     Pupils: Pupils are equal, round, and reactive to light.  Cardiovascular:     Rate and Rhythm: Normal rate and regular rhythm.     Pulses: Normal pulses.     Heart sounds: No murmur heard. No gallop.    Pulmonary:     Effort: Pulmonary effort is normal.     Breath sounds: Normal breath sounds. No wheezing or rales.  Abdominal:     Palpations: Abdomen is soft.     Tenderness: There is no abdominal tenderness.  Musculoskeletal:     Cervical back: Neck supple.     Right lower leg: No edema.     Left lower leg: No edema.  Lymphadenopathy:     Cervical: No cervical adenopathy.  Skin:    General: Skin is warm.     Findings: No rash.  Neurological:     General: No focal deficit present.     Mental Status: She is alert and oriented to person, place, and  time.  Psychiatric:        Mood and Affect: Mood normal.        Behavior: Behavior normal.            Assessment & Plan:

## 2020-05-02 NOTE — Patient Instructions (Addendum)
Please set up your screening mammogram ASAP. I would like to see you in 3 months if you are taking the semaglutide---if not, I will see you in a year for your physical.

## 2020-05-05 ENCOUNTER — Other Ambulatory Visit (INDEPENDENT_AMBULATORY_CARE_PROVIDER_SITE_OTHER): Payer: 59

## 2020-05-05 DIAGNOSIS — Z1211 Encounter for screening for malignant neoplasm of colon: Secondary | ICD-10-CM

## 2020-05-05 LAB — FECAL OCCULT BLOOD, IMMUNOCHEMICAL: Fecal Occult Bld: NEGATIVE

## 2020-05-08 ENCOUNTER — Telehealth: Payer: Self-pay

## 2020-05-08 NOTE — Telephone Encounter (Signed)
Completed PA on CoverMyMeds. Most likely will be denied. Requires that she try one of these: METFORMIN HCL ER TABS 500MG   METFORMIN HCL ER TABS 750MG   BYETTA INJECT/PEN 5MCG .  BYETTA INJECT/PEN 10MCG .  TRULICITY SD PEN 6.7YP 4'S 0. 0.75MG   TRULICITY SD PEN 9.5KD 4'S 1. 1.5MG   BYDUREON BCISE AUTOINJECTR 0. 2MG   TRULICITY SD PEN 3.2IZ 4'S 3MG 

## 2020-05-08 NOTE — Telephone Encounter (Signed)
Okay to change to trulicity then--- 9.51 weekly for 1 month, then 1.5 weekly. I would then increase to 3mg  if she is tolerating it Send one month of each of those and then have her contact us near the end of the 2nd month to decide whether to increase

## 2020-05-10 NOTE — Telephone Encounter (Signed)
I called to talk to her about Trulicity. She is asking if this is something that she would need to stay on to keep the weight off or will it be hard to come off of? She does not want to be dependent on a medication and she does not want it to be hard to come off of either. She said she is watching her carb intake. She does not want to take metformin.

## 2020-05-10 NOTE — Telephone Encounter (Signed)
She could stop it after successful weight loss. Would have to maintain better eating, etc---but there is no dependence potential

## 2020-05-11 NOTE — Telephone Encounter (Signed)
Spoke to pt. She is going to investigate the Trulicity and get back with [place].

## 2020-05-12 NOTE — Telephone Encounter (Signed)
Lindsey Walls with Cover my meds left v/m that error message came back from ins co for Rybelosus PA and can call cover my meds for next step to resubmit the PA; ref # [id number]

## 2020-05-15 NOTE — Telephone Encounter (Signed)
We are not moving forward with the Rybelsus.

## 2020-06-14 ENCOUNTER — Ambulatory Visit
Admission: RE | Admit: 2020-06-14 | Discharge: 2020-06-14 | Disposition: A | Discharge status: Home / Self Care | Payer: 59 | Source: Ambulatory Visit | Attending: Internal Medicine | Admitting: Internal Medicine

## 2020-06-14 ENCOUNTER — Other Ambulatory Visit: Payer: Self-pay

## 2020-06-14 DIAGNOSIS — Z1231 Encounter for screening mammogram for malignant neoplasm of breast: Secondary | ICD-10-CM

## 2020-08-01 ENCOUNTER — Ambulatory Visit: Payer: 59 | Admitting: Internal Medicine

## 2020-08-22 ENCOUNTER — Other Ambulatory Visit: Payer: Self-pay | Admitting: Internal Medicine

## 2020-08-30 NOTE — Telephone Encounter (Signed)
Patient requesting a refill of hydrocodone-homatropine solution, however this is not on our current medications list. Please advise.

## 2020-08-31 MED ORDER — HYDROCODONE BIT-HOMATROP MBR 5-1.5 MG/5ML PO SOLN: 5.0000 mL | Freq: Every evening | ORAL | 0 refills | Status: DC | PRN

## 2020-08-31 NOTE — Telephone Encounter (Signed)
Patient left voicemail on triage line requesting refill on her hydrocodone-homatropine.  I returned patient's call to let her know Dr. Silvio Pate is not in the office until this afternoon but the request has been sent to him for approval. Patient states understanding.

## 2020-09-12 NOTE — Progress Notes (Signed)
Chelcy Bolda T. Navjot Pilgrim, MD, [facility] [place], [address]  Phone: [phone]  FAX: [place] - 50 y.o. female  MRN [id number]  Date of Birth: Aug 07, 1970  Date: 09/13/2020  PCP: Venia Carbon, MD  Referral: Venia Carbon, MD  Chief Complaint  Patient presents with   Sciatica    Left    This visit occurred during the SARS-CoV-2 public health emergency.  Safety protocols were in place, including screening questions prior to the visit, additional usage of staff PPE, and extensive cleaning of exam room while observing appropriate contact time as indicated for disinfecting solutions.   Subjective:   Lindsey Walls is a 50 y.o. very pleasant female patient with Body mass index is 50.52 kg/m. who presents with the following:  The patient presents for acute evaluation with need for some acute on chronic back pain, and it does look as if the patient has had this issue before and she was recently given some Naprosyn as well as baclofen by my partner Dr. Silvio Pate and October 2021.  There does not appear to be a history of any interventions based on chart review.  L glute to the ankle with tingling. Has always had some back pain.  Has been much better.  Has not taken it in a lot time.  Sitting a lot more often during covid. Has had a lot of stress.  Father and her niece died. Yoga trial.   Doing some sciatic pain relief. Trouble putting pressure.  As the day goes on it will get better.   Get back to doing Yoga  pira rehab pred  x8  But hold for 3 weeks - may not need.  Review of Systems is noted in the HPI, as appropriate   Objective:   BP 122/80   Pulse 67   Temp 98.1 F (36.7 C) (Temporal)   Ht 5\' 6"  (1.676 m)   Wt (!) 313 lb (142 kg)   LMP 10/12/2012   SpO2 99%   BMI 50.52 kg/m    Range of motion at  the waist: Flexion,  extension, lateral bending and rotation:  relatively preserved  No echymosis or edema Rises to examination table with mild difficulty Gait: minimally antalgic  Inspection/Deformity: N Paraspinus Tenderness: L3-S1 B  B Ankle Dorsiflexion (L5,4): 5/5 B Great Toe Dorsiflexion (L5,4): 5/5 Heel Walk (L5): WNL Toe Walk (S1): WNL Rise/Squat (L4): WNL, mild pain  SENSORY B Medial Foot (L4): WNL B Dorsum (L5): WNL B Lateral (S1): WNL Light Touch: WNL Pinprick: WNL  REFLEXES Knee (L4): 2+ Ankle (S1): 2+  B SLR, seated: neg B SLR, supine: neg B FABER: neg B Reverse FABER: neg B Greater Troch: NT B Log Roll: neg B Stork: NT B Sciatic Notch: NT   Radiology: No results found.  Assessment and Plan:     ICD-10-CM   1. Acute left-sided low back pain with left-sided sciatica  M54.42      Acute on chronic with exacerbation She has a pretty benign exam.  I am getting have her do some basic hip and piriformis work.  I think that a lot of this might be to have some increased sitting due to COVID-19 and sitting much of the day while working from home.  I Georgina Peer have her do a dedicated hip and basic back program.  She is not improving after a few  weeks, I think that doing a round of oral steroids is appropriate.  She has been trying some NSAIDs as well as muscle relaxants.  She also understands that losing weight will take pressure off of her back.  Meds ordered this encounter  Medications   predniSONE (DELTASONE) 20 MG tablet    Sig: 2 tabs po for 4 days, then 1 tab po for 4 days    Dispense:  12 tablet    Refill:  0    Signed,  Copeland Lapier T. Frenchie Pribyl, MD   Outpatient Encounter Medications as of 09/13/2020  Medication Sig   albuterol (VENTOLIN HFA) 108 (90 Base) MCG/ACT inhaler inhale 2 puffs by mouth every 6 hours if needed   baclofen (LIORESAL) 10 MG tablet TAKE 1 TABLET(10 MG) BY MOUTH THREE TIMES DAILY AS NEEDED FOR MUSCLE SPASMS   colchicine 0.6 MG tablet Take 1 tablet (0.6  mg total) by mouth 2 (two) times daily as needed. For gout attack   diclofenac (VOLTAREN) 75 MG EC tablet TAKE 1 TABLET(75 MG) BY MOUTH TWICE DAILY   HYDROcodone bit-homatropine (HYCODAN) 5-1.5 MG/5ML syrup Take 5 mLs by mouth at bedtime as needed for cough.   lisinopril-hydrochlorothiazide (ZESTORETIC) 20-25 MG tablet 1   MISC NATURAL PRODUCTS PO Take by mouth. CBD oil   naproxen (NAPROSYN) 500 MG tablet TAKE 1 TABLET(500 MG) BY MOUTH TWICE DAILY WITH A MEAL   predniSONE (DELTASONE) 20 MG tablet 2 tabs po for 4 days, then 1 tab po for 4 days   No facility-administered encounter medications on file as of 09/13/2020.

## 2020-09-13 ENCOUNTER — Encounter: Payer: Self-pay | Admitting: Family Medicine

## 2020-09-13 ENCOUNTER — Ambulatory Visit (INDEPENDENT_AMBULATORY_CARE_PROVIDER_SITE_OTHER): Payer: 59 | Admitting: Family Medicine

## 2020-09-13 ENCOUNTER — Other Ambulatory Visit: Payer: Self-pay

## 2020-09-13 VITALS — BP 122/80 | HR 67 | Temp 98.1°F | Ht 66.0 in | Wt 313.0 lb

## 2020-09-13 DIAGNOSIS — M5442 Lumbago with sciatica, left side: Secondary | ICD-10-CM | POA: Diagnosis not present

## 2020-09-13 MED ORDER — PREDNISONE 20 MG PO TABS: ORAL_TABLET | ORAL | 0 refills | Status: DC

## 2020-09-15 ENCOUNTER — Encounter: Payer: Self-pay | Admitting: Family Medicine

## 2020-10-02 ENCOUNTER — Other Ambulatory Visit: Payer: Self-pay | Admitting: Internal Medicine

## 2020-10-02 DIAGNOSIS — M545 Low back pain, unspecified: Secondary | ICD-10-CM

## 2020-10-31 ENCOUNTER — Other Ambulatory Visit: Payer: Self-pay | Admitting: Internal Medicine

## 2020-10-31 DIAGNOSIS — M545 Low back pain, unspecified: Secondary | ICD-10-CM

## 2020-12-07 DIAGNOSIS — U071 COVID-19: Secondary | ICD-10-CM

## 2020-12-07 HISTORY — DX: COVID-19: U07.1

## 2020-12-20 ENCOUNTER — Other Ambulatory Visit: Payer: Self-pay

## 2020-12-20 MED ORDER — ALBUTEROL SULFATE HFA 108 (90 BASE) MCG/ACT IN AERS: INHALATION_SPRAY | RESPIRATORY_TRACT | 1 refills | Status: DC

## 2020-12-20 NOTE — Telephone Encounter (Signed)
Last OV- 09/13/2020 Next OV- N/A Last filled- 08/31/2020

## 2020-12-21 ENCOUNTER — Other Ambulatory Visit: Payer: Self-pay | Admitting: Internal Medicine

## 2020-12-21 ENCOUNTER — Telehealth: Payer: Self-pay | Admitting: Internal Medicine

## 2020-12-21 MED ORDER — HYDROCODONE BIT-HOMATROP MBR 5-1.5 MG/5ML PO SOLN: 5.0000 mL | Freq: Every evening | ORAL | 0 refills | Status: DC | PRN

## 2020-12-21 MED ORDER — PROAIR HFA 108 (90 BASE) MCG/ACT IN AERS: 2.0000 | INHALATION_SPRAY | Freq: Four times a day (QID) | RESPIRATORY_TRACT | 0 refills | Status: DC | PRN

## 2020-12-21 NOTE — Telephone Encounter (Signed)
Yes, I did the inhaler. I had to change it to [employer] due to insurance.

## 2020-12-21 NOTE — Telephone Encounter (Signed)
Changed Rx to [employer]. Usually if they want a PA for Ventolin, it needs to be changed to [employer].

## 2020-12-26 ENCOUNTER — Other Ambulatory Visit: Payer: Self-pay

## 2020-12-26 ENCOUNTER — Ambulatory Visit
Admission: EM | Admit: 2020-12-26 | Discharge: 2020-12-26 | Disposition: A | Discharge status: Home / Self Care | Payer: 59 | Attending: Emergency Medicine | Admitting: Emergency Medicine

## 2020-12-26 ENCOUNTER — Ambulatory Visit (INDEPENDENT_AMBULATORY_CARE_PROVIDER_SITE_OTHER): Payer: 59

## 2020-12-26 ENCOUNTER — Telehealth: Payer: Self-pay

## 2020-12-26 DIAGNOSIS — J1282 Pneumonia due to coronavirus disease 2019: Secondary | ICD-10-CM | POA: Diagnosis not present

## 2020-12-26 DIAGNOSIS — R059 Cough, unspecified: Secondary | ICD-10-CM | POA: Diagnosis not present

## 2020-12-26 DIAGNOSIS — U071 COVID-19: Secondary | ICD-10-CM

## 2020-12-26 MED ORDER — PREDNISONE 50 MG PO TABS: 50.0000 mg | ORAL_TABLET | Freq: Every day | ORAL | 0 refills | Status: DC

## 2020-12-26 MED ORDER — AEROCHAMBER PLUS MISC: 2 refills | Status: DC

## 2020-12-26 MED ORDER — DOXYCYCLINE HYCLATE 100 MG PO CAPS: 100.0000 mg | ORAL_CAPSULE | Freq: Two times a day (BID) | ORAL | 0 refills | Status: AC

## 2020-12-26 NOTE — Telephone Encounter (Signed)
I spoke with pt; pt tested positive for covid on 12/25/20; pt was tested at site off [facility] in [place]. On 12/19/20 started with symptoms, chills, and pt felt bad, cannot describe more completely; on 12/20/20 no energy, dry cough, SOB with exertion,scratchy S/T and head congestion. No h/a, diarrhea or vomiting, and no fever, and no loss of taste or smell; and no runny nose. Pt is in no distress with breathing but pt is wheezing a lot. Hx of bronchitis.pt wanted to know how could listen to chest if does video visit; Pt is going to [facility] in [place] now for eval and possible testing if needed. Pt wants to keep video appt with Dr Silvio Pate on 12/27/20. Sending note to Dr Silvio Pate and Larene Beach CMA and Larene Beach RN. Will team both Shannons.

## 2020-12-26 NOTE — ED Triage Notes (Signed)
Pt c/o COVID positive yesterday at a testing site. Pt states chills and cough initially, then some wheezing, this was last week. Pt states over the weekend she felt much worse. Pt's PCP advised a COVID test. Pt continues to have cough and wheeze with some chest tightness. Pt denies f/n/v/d or other symptoms.

## 2020-12-26 NOTE — ED Provider Notes (Addendum)
HPI  SUBJECTIVE:  Lindsey Walls is a 50 y.o. female who presents with being "sick" for the past 6 days.  She tested positive for COVID yesterday. she reports scratchy sore throat, feeling "blah", chills, fatigue, nasal congestion, cough, wheeze.  She states that her coughing is getting worse.  She is unable to sleep at night secondary to the coughing.  No fevers, body aches, headaches, rhinorrhea, loss of smell or taste, chest pain, shortness of breath, nausea, vomiting, diarrhea, abdominal pain.  She got the second COVID vaccine.  No known exposure to COVID.  No antibiotics in the past 3 months.  She took an antipyretic within the past 6 hours.  She has been using her albuterol every 2-3 hours, Alka-Seltzer cold and cough every 4 hours and Hycodan cough syrup with improvement in her symptoms.  She also states her symptoms are better with sitting up.  They are worse with lying down, exertion.  States that she felt better for few days, but then got worse.  She called her PMD today, who sent her in for chest x-ray.  She has a past medical history of hypertension, hypercholesterolemia, asthma, bronchitis, 1 episode of gout.  No history of congestive heart failure, COVID.  SHF:[id number], Theophilus Kinds, MD   Past Medical History:  Diagnosis Date   Anemia    Bronchitis, chronic (HCC)    Chronic back pain    COVID-19 12/2020   Fibroid    Hyperlipidemia    Hypertension    Obesity    Obesity     Past Surgical History:  Procedure Laterality Date   ABDOMINAL HYSTERECTOMY N/A 11/23/2012   Procedure: HYSTERECTOMY ABDOMINAL with repair of laproscopic torcar incision; lysis of adhesions ;  Surgeon: Daria Pastures, MD;  Location: [facility];  Service: Gynecology;  Laterality: N/A;   CESAREAN SECTION     CHOLECYSTECTOMY     CYSTOSCOPY N/A 11/23/2012   Procedure: CYSTOSCOPY;  Surgeon: Daria Pastures, MD;  Location: [facility];  Service: Gynecology;  Laterality: N/A;   ENDOMETRIAL ABLATION  08/08   OMENTECTOMY  N/A 11/23/2012   Procedure: OMENTECTOMY;  Surgeon: Daria Pastures, MD;  Location: [facility];  Service: Gynecology;  Laterality: N/A;   ROBOTIC ASSISTED TOTAL HYSTERECTOMY N/A 11/23/2012   Procedure: ATTEMPTED ROBOTIC ASSISTED TOTAL HYSTERECTOMY;  Surgeon: Daria Pastures, MD;  Location: [facility];  Service: Gynecology;  Laterality: N/A;   TIBIA FRACTURE SURGERY      Family History  Problem Relation Age of Onset   Coronary artery disease Mother    Hypertension Father    Cancer Father        lung   COPD Father    Coronary artery disease Sister     Social History   Tobacco Use   Smoking status: Never   Smokeless tobacco: Never  Vaping Use   Vaping Use: Never used  Substance Use Topics   Alcohol use: No    Alcohol/week: 0.0 standard drinks   Drug use: No    No current facility-administered medications for this encounter.  Current Outpatient Medications:    baclofen (LIORESAL) 10 MG tablet, TAKE 1 TABLET(10 MG) BY MOUTH THREE TIMES DAILY AS NEEDED FOR MUSCLE SPASMS, Disp: 30 tablet, Rfl: 0   diclofenac (VOLTAREN) 75 MG EC tablet, TAKE 1 TABLET(75 MG) BY MOUTH TWICE DAILY, Disp: 30 tablet, Rfl: 0   doxycycline (VIBRAMYCIN) 100 MG capsule, Take 1 capsule (100 mg total) by mouth 2 (two) times daily for 5 days., Disp: 10  capsule, Rfl: 0   HYDROcodone bit-homatropine (HYCODAN) 5-1.5 MG/5ML syrup, Take 5 mLs by mouth at bedtime as needed for cough., Disp: 120 mL, Rfl: 0   lisinopril-hydrochlorothiazide (ZESTORETIC) 20-25 MG tablet, 1, Disp: 90 tablet, Rfl: 3   MISC NATURAL PRODUCTS PO, Take by mouth. CBD oil, Disp: , Rfl:    naproxen (NAPROSYN) 500 MG tablet, TAKE 1 TABLET(500 MG) BY MOUTH TWICE DAILY WITH A MEAL, Disp: 60 tablet, Rfl: 0   predniSONE (DELTASONE) 50 MG tablet, Take 1 tablet (50 mg total) by mouth daily with breakfast., Disp: 5 tablet, Rfl: 0   PROAIR HFA 108 (90 Base) MCG/ACT inhaler, Inhale 2 puffs into the lungs every 6 (six) hours as needed for wheezing or shortness of  breath., Disp: 18 g, Rfl: 0   Spacer/Aero-Holding Chambers (AEROCHAMBER PLUS) inhaler, Use as instructed, Disp: 1 each, Rfl: 2  No Known Allergies   ROS  As noted in HPI.   Physical Exam  BP (!) 180/97 (BP Location: Left Wrist)   Pulse 63   Temp 98.6 F (37 C) (Oral)   Resp 18   Ht 5' 5.5" (1.664 m)   Wt (!) 137 kg   LMP 10/12/2012   SpO2 96%   BMI 49.49 kg/m   Constitutional: Well developed, well nourished, no acute distress Eyes:  EOMI, conjunctiva normal bilaterally HENT: Normocephalic, atraumatic,mucus membranes moist.  No nasal congestion.  No sinus tenderness Respiratory: Normal inspiratory effort, lungs clear bilaterally.  No anterior, lateral chest wall tenderness Cardiovascular: Normal rate regular rhythm, no murmurs rubs or gallops GI: nondistended skin: No rash, skin intact Musculoskeletal: no deformities Neurologic: Alert & oriented x 3, no focal neuro deficits Psychiatric: Speech and behavior appropriate   ED Course   Medications - No data to display  Orders Placed This Encounter  Procedures   DG Chest 2 View    Standing Status:   Standing    Number of Occurrences:   1    Order Specific Question:   Reason for Exam (SYMPTOM  OR DIAGNOSIS REQUIRED)    Answer:   covid + r/o PNA    No results found for this or any previous visit (from the past 24 hour(s)). DG Chest 2 View  Result Date: 12/26/2020 CLINICAL DATA:  COVID positive, rule out pneumonia. Cough and chills. EXAM: CHEST - 2 VIEW COMPARISON:  Remote exam 04/29/2006 FINDINGS: Minimal patchy opacity in the periphery of the right mid lung zone. Left lung is clear. Normal heart size and mediastinal contours. No pneumothorax, pleural effusion, or pulmonary edema. No acute osseous abnormalities are seen. IMPRESSION: Minimal patchy opacity in the periphery of the right mid lung zone, likely pneumonia in the setting of COVID-19. Electronically Signed   By: Keith Rake M.D.   On: 12/26/2020 19:38     ED Clinical Impression  1. Pneumonia due to COVID-19 virus   2. COVID-19 virus infection      ED Assessment/Plan  Reviewed imaging independently.  Minimal patchy opacity in the periphery of the right midlung zone, likely pneumonia.  See radiology report for full details.  Patient has a small patchy opacity in the periphery of the right midlung zone that is concerning for pneumonia.   She is out of the window for antiviral treatment.  She is afebrile, but she just took an antipyretic.  However, the rest of her vitals are normal.    Patient with a COVID infection and pneumonia.  She just refilled her albuterol inhaler.  Given that she  felt better and then got worse, concern for secondary bacterial pneumonia.  Will send home with doxycycline for pneumonia, a spacer, regularly scheduled albuterol for the next 4 days, prednisone 50 mg for 5 days, discontinue Alka-Seltzer cold and cough.  Mucinex, saline nasal irrigation if nasal congestion becomes bothersome.  She has a video visit scheduled with her PMD tomorrow.  Discussed imaging, MDM, treatment plan, and plan for follow-up with patient. Discussed sn/sx that should prompt return to the ED. patient agrees with plan.   Meds ordered this encounter  Medications   predniSONE (DELTASONE) 50 MG tablet    Sig: Take 1 tablet (50 mg total) by mouth daily with breakfast.    Dispense:  5 tablet    Refill:  0   Spacer/Aero-Holding Chambers (AEROCHAMBER PLUS) inhaler    Sig: Use as instructed    Dispense:  1 each    Refill:  2   doxycycline (VIBRAMYCIN) 100 MG capsule    Sig: Take 1 capsule (100 mg total) by mouth 2 (two) times daily for 5 days.    Dispense:  10 capsule    Refill:  0      *This clinic note was created using [employer]. Therefore, there may be occasional mistakes despite careful proofreading.  ?    Melynda Ripple, MD 12/27/20 1718    Melynda Ripple, MD 12/27/20 [phone]

## 2020-12-26 NOTE — Discharge Instructions (Addendum)
Your x-ray had a very small opacity in the right midlung, which was concerning for pneumonia.  However because this is a viral infection, I am deferring antibiotic treatment take 2 puffs from your albuterol inhaler using your spacer every 4 hours for 2 days, then every 6 hours for 2 days, then as needed.  You may back off on the albuterol if you start to feel better sooner.  Prednisone 50 mg for 5 days, discontinue Alka-Seltzer cold and cough.  Mucinex, saline nasal irrigation if nasal congestion becomes bothersome.  Keep an eye on your blood pressure.  Do not take Voltaren and Naprosyn as they are both NSAIDs.  Take one or the other.

## 2020-12-27 ENCOUNTER — Telehealth (INDEPENDENT_AMBULATORY_CARE_PROVIDER_SITE_OTHER): Payer: 59 | Admitting: Internal Medicine

## 2020-12-27 ENCOUNTER — Encounter: Payer: Self-pay | Admitting: Internal Medicine

## 2020-12-27 DIAGNOSIS — J189 Pneumonia, unspecified organism: Secondary | ICD-10-CM

## 2020-12-27 NOTE — Telephone Encounter (Signed)
Reviewed note. Given doxy for subtle CXR findings of pneumonia---and prednisone. Will review status at virtual visit later today

## 2020-12-27 NOTE — Assessment & Plan Note (Signed)
Likely to just be COVID pneumonia and she is actually feeling some better Discussed that it might be bacterial though---so try the doxycycline antibiotic (but stop if it makes her sick) Has cough syrup Only use the prednisone if more congestion or SOB  Works from home so no problems with working

## 2020-12-27 NOTE — Progress Notes (Signed)
Subjective:    Patient ID: Lindsey Walls, female    DOB: June 17, 1970, 50 y.o.   MRN: [id number]  HPI Video virtual visit for pneumonia Identification done Reviewed limitations and billing and she gave consent Participants--patient in her home and I am in my office  Feeling bad last week---but didn't think it was COVID Cough, etc--just like past bronchitis Worsened after 4 days or so--called for appt Tested and was positive (8 days ago)  Chills one night--no clear fever Did have some SOB---climbing up steps with laundry (not unusual)  Went to urgent care--recommended by our nurse RML pneumonia found Feeling better now--and hasn't even started the doxycycline Hasn't started prednisone either  Current Outpatient Medications on File Prior to Visit  Medication Sig Dispense Refill  . baclofen (LIORESAL) 10 MG tablet TAKE 1 TABLET(10 MG) BY MOUTH THREE TIMES DAILY AS NEEDED FOR MUSCLE SPASMS 30 tablet 0  . diclofenac (VOLTAREN) 75 MG EC tablet TAKE 1 TABLET(75 MG) BY MOUTH TWICE DAILY 30 tablet 0  . doxycycline (VIBRAMYCIN) 100 MG capsule Take 1 capsule (100 mg total) by mouth 2 (two) times daily for 5 days. 10 capsule 0  . HYDROcodone bit-homatropine (HYCODAN) 5-1.5 MG/5ML syrup Take 5 mLs by mouth at bedtime as needed for cough. 120 mL 0  . lisinopril-hydrochlorothiazide (ZESTORETIC) 20-25 MG tablet 1 90 tablet 3  . MISC NATURAL PRODUCTS PO Take by mouth. CBD oil    . naproxen (NAPROSYN) 500 MG tablet TAKE 1 TABLET(500 MG) BY MOUTH TWICE DAILY WITH A MEAL 60 tablet 0  . predniSONE (DELTASONE) 50 MG tablet Take 1 tablet (50 mg total) by mouth daily with breakfast. 5 tablet 0  . PROAIR HFA 108 (90 Base) MCG/ACT inhaler Inhale 2 puffs into the lungs every 6 (six) hours as needed for wheezing or shortness of breath. 18 g 0  . Spacer/Aero-Holding Chambers (AEROCHAMBER PLUS) inhaler Use as instructed 1 each 2   No current facility-administered medications on file prior to visit.    No  Known Allergies  Past Medical History:  Diagnosis Date  . Anemia   . Bronchitis, chronic ([facility])   . Chronic back pain   . COVID-19 12/2020  . Fibroid   . Hyperlipidemia   . Hypertension   . Obesity   . Obesity     Past Surgical History:  Procedure Laterality Date  . ABDOMINAL HYSTERECTOMY N/A 11/23/2012   Procedure: HYSTERECTOMY ABDOMINAL with repair of laproscopic torcar incision; lysis of adhesions ;  Surgeon: Daria Pastures, MD;  Location: [facility];  Service: Gynecology;  Laterality: N/A;  . CESAREAN SECTION    . CHOLECYSTECTOMY    . CYSTOSCOPY N/A 11/23/2012   Procedure: CYSTOSCOPY;  Surgeon: Daria Pastures, MD;  Location: [facility];  Service: Gynecology;  Laterality: N/A;  . ENDOMETRIAL ABLATION  08/08  . OMENTECTOMY N/A 11/23/2012   Procedure: OMENTECTOMY;  Surgeon: Daria Pastures, MD;  Location: [facility];  Service: Gynecology;  Laterality: N/A;  . ROBOTIC ASSISTED TOTAL HYSTERECTOMY N/A 11/23/2012   Procedure: ATTEMPTED ROBOTIC ASSISTED TOTAL HYSTERECTOMY;  Surgeon: Daria Pastures, MD;  Location: [facility];  Service: Gynecology;  Laterality: N/A;  . TIBIA FRACTURE SURGERY      Family History  Problem Relation Age of Onset  . Coronary artery disease Mother   . Hypertension Father   . Cancer Father        lung  . COPD Father   . Coronary artery disease Sister     Social  History   Socioeconomic History  . Marital status: Single    Spouse name: Not on file  . Number of children: 2  . Years of education: Not on file  . Highest education level: Not on file  Occupational History  . Occupation: Electronics engineer    Comment: [employer]  Tobacco Use  . Smoking status: Never  . Smokeless tobacco: Never  Vaping Use  . Vaping Use: Never used  Substance and Sexual Activity  . Alcohol use: No    Alcohol/week: 0.0 standard drinks  . Drug use: No  . Sexual activity: Not on file  Other Topics Concern  . Not on file  Social History Narrative  . Not on file    Social Determinants of Health   Financial Resource Strain: Not on file  Food Insecurity: Not on file  Transportation Needs: Not on file  Physical Activity: Not on file  Stress: Not on file  Social Connections: Not on file  Intimate Partner Violence: Not on file   Review of Systems No N/V Appetite is off No loss of smell or taste    Objective:   Physical Exam Constitutional:      Appearance: Normal appearance.  Pulmonary:     Effort: Pulmonary effort is normal. No respiratory distress.  Neurological:     Mental Status: She is alert.           Assessment & Plan:

## 2021-04-10 ENCOUNTER — Encounter: Payer: Self-pay | Admitting: Internal Medicine

## 2021-04-10 MED ORDER — LISINOPRIL-HYDROCHLOROTHIAZIDE 20-25 MG PO TABS: ORAL_TABLET | ORAL | 0 refills | Status: DC

## 2021-04-10 NOTE — Addendum Note (Signed)
Addended by: Pilar Grammes on: 04/10/2021 04:04 PM   Modules accepted: Orders

## 2021-04-11 ENCOUNTER — Other Ambulatory Visit: Payer: Self-pay | Admitting: Internal Medicine

## 2021-04-17 ENCOUNTER — Other Ambulatory Visit: Payer: Self-pay | Admitting: Internal Medicine

## 2021-04-17 DIAGNOSIS — M545 Low back pain, unspecified: Secondary | ICD-10-CM

## 2021-06-08 ENCOUNTER — Encounter: Payer: Self-pay | Admitting: Internal Medicine

## 2021-06-08 ENCOUNTER — Other Ambulatory Visit: Payer: Self-pay

## 2021-06-08 ENCOUNTER — Ambulatory Visit (INDEPENDENT_AMBULATORY_CARE_PROVIDER_SITE_OTHER): Payer: 59 | Admitting: Internal Medicine

## 2021-06-08 VITALS — BP 128/84 | HR 65 | Temp 97.4°F | Ht 66.0 in | Wt 306.0 lb

## 2021-06-08 DIAGNOSIS — E119 Type 2 diabetes mellitus without complications: Secondary | ICD-10-CM | POA: Diagnosis not present

## 2021-06-08 MED ORDER — METFORMIN HCL ER 500 MG PO TB24: 1000.0000 mg | ORAL_TABLET | Freq: Every day | ORAL | 3 refills | Status: DC

## 2021-06-08 NOTE — Progress Notes (Signed)
? ?Subjective:  ? ? Patient ID: Lindsey Walls, female    DOB: January 26, 1971, 51 y.o.   MRN: [id number] ? ?HPI ?Here due to concerns about elevated sugars ? ?Just started checking sugars in the past few weeks ?Feels great! ?No excess thirst or urination ?Energy levels are good ?She had friend that had glucose over 1000-----so she got scared ?Has cut out carbs and sugared drinks ?Sugars keep going up though---266-361 ? ?Current Outpatient Medications on File Prior to Visit  ?Medication Sig Dispense Refill  ? baclofen (LIORESAL) 10 MG tablet TAKE 1 TABLET(10 MG) BY MOUTH THREE TIMES DAILY AS NEEDED FOR MUSCLE SPASMS 30 tablet 0  ? diclofenac (VOLTAREN) 75 MG EC tablet TAKE 1 TABLET(75 MG) BY MOUTH TWICE DAILY 30 tablet 0  ? lisinopril-hydrochlorothiazide (ZESTORETIC) 20-25 MG tablet 1 90 tablet 0  ? MISC NATURAL PRODUCTS PO Take by mouth. CBD oil    ? naproxen (NAPROSYN) 500 MG tablet TAKE 1 TABLET(500 MG) BY MOUTH TWICE DAILY WITH A MEAL 60 tablet 0  ? PROAIR HFA 108 (90 Base) MCG/ACT inhaler Inhale 2 puffs into the lungs every 6 (six) hours as needed for wheezing or shortness of breath. 18 g 0  ? ?No current facility-administered medications on file prior to visit.  ? ? ?No Known Allergies ? ?Past Medical History:  ?Diagnosis Date  ? Anemia   ? Bronchitis, chronic ([facility])   ? Chronic back pain   ? COVID-19 12/2020  ? Fibroid   ? Hyperlipidemia   ? Hypertension   ? Obesity   ? Obesity   ? ? ?Past Surgical History:  ?Procedure Laterality Date  ? ABDOMINAL HYSTERECTOMY N/A 11/23/2012  ? Procedure: HYSTERECTOMY ABDOMINAL with repair of laproscopic torcar incision; lysis of adhesions ;  Surgeon: Daria Pastures, MD;  Location: [facility];  Service: Gynecology;  Laterality: N/A;  ? CESAREAN SECTION    ? CHOLECYSTECTOMY    ? CYSTOSCOPY N/A 11/23/2012  ? Procedure: CYSTOSCOPY;  Surgeon: Daria Pastures, MD;  Location: [facility];  Service: Gynecology;  Laterality: N/A;  ? ENDOMETRIAL ABLATION  08/08  ? OMENTECTOMY N/A 11/23/2012  ?  Procedure: OMENTECTOMY;  Surgeon: Daria Pastures, MD;  Location: [facility];  Service: Gynecology;  Laterality: N/A;  ? ROBOTIC ASSISTED TOTAL HYSTERECTOMY N/A 11/23/2012  ? Procedure: ATTEMPTED ROBOTIC ASSISTED TOTAL HYSTERECTOMY;  Surgeon: Daria Pastures, MD;  Location: [facility];  Service: Gynecology;  Laterality: N/A;  ? TIBIA FRACTURE SURGERY    ? ? ?Family History  ?Problem Relation Age of Onset  ? Coronary artery disease Mother   ? Hypertension Father   ? Cancer Father   ?     lung  ? COPD Father   ? Coronary artery disease Sister   ? ? ?Social History  ? ?Socioeconomic History  ? Marital status: Single  ?  Spouse name: Not on file  ? Number of children: 2  ? Years of education: Not on file  ? Highest education level: Not on file  ?Occupational History  ? Occupation: Electronics engineer  ?  Comment: [employer]  ?Tobacco Use  ? Smoking status: Never  ?  Passive exposure: Past  ? Smokeless tobacco: Never  ?Vaping Use  ? Vaping Use: Never used  ?Substance and Sexual Activity  ? Alcohol use: No  ?  Alcohol/week: 0.0 standard drinks  ? Drug use: No  ? Sexual activity: Not on file  ?Other Topics Concern  ? Not on file  ?Social History Narrative  ?  Not on file  ? ?Social Determinants of Health  ? ?Financial Resource Strain: Not on file  ?Food Insecurity: Not on file  ?Transportation Needs: Not on file  ?Physical Activity: Not on file  ?Stress: Not on file  ?Social Connections: Not on file  ?Intimate Partner Violence: Not on file  ? ?Review of Systems ?Sleeps well ?No chest pain or SOB ? ?   ?Objective:  ? Physical Exam ?Constitutional:   ?   Appearance: Normal appearance.  ?Neurological:  ?   Mental Status: She is alert.  ?Psychiatric:     ?   Mood and Affect: Mood normal.     ?   Behavior: Behavior normal.  ?  ? ? ? ? ?   ?Assessment & Plan:  ? ?

## 2021-06-08 NOTE — Assessment & Plan Note (Signed)
A1c 10.9%!! ? ?She has even improved her lifestyle ?[facility] with [facility] ?Will start metformin 500--1000 ?Consider semaglutide or dulaglutide at next appt ?

## 2021-06-25 ENCOUNTER — Encounter: Payer: Self-pay | Admitting: Internal Medicine

## 2021-06-29 ENCOUNTER — Encounter: Payer: Self-pay | Admitting: *Deleted

## 2021-06-29 ENCOUNTER — Encounter: Payer: 59 | Attending: Internal Medicine | Admitting: *Deleted

## 2021-06-29 ENCOUNTER — Other Ambulatory Visit: Payer: Self-pay

## 2021-06-29 VITALS — BP 132/90 | Ht 65.5 in | Wt 307.6 lb

## 2021-06-29 DIAGNOSIS — E119 Type 2 diabetes mellitus without complications: Secondary | ICD-10-CM | POA: Insufficient documentation

## 2021-06-29 DIAGNOSIS — Z713 Dietary counseling and surveillance: Secondary | ICD-10-CM | POA: Diagnosis not present

## 2021-06-29 DIAGNOSIS — E1165 Type 2 diabetes mellitus with hyperglycemia: Secondary | ICD-10-CM

## 2021-06-29 NOTE — Patient Instructions (Signed)
Check blood sugars before breakfast or 2 hrs after one meal every day ?Bring blood sugar records to the next appointment ? ?Continue walking   for    5000 steps daily   gradually increase to 150 minutes of walking per week ? ?Eat 3 meals day,   1-2  snacks a day ?Space meals 4-6 hours apart ?Eat at least eat 1 protein and 1 carbohydrate instead of skipping ?Complete 3 Day Food Record and bring to next appointment ? ?Return for appointment on:  Friday July 27, 2021  at 8:30 am with Freda Munro (nurse) ?

## 2021-06-29 NOTE — Progress Notes (Signed)
Diabetes Self-Management Education ? ?Visit Type: First/Initial ? ?Appt. Start Time: 1005 Appt. End Time: 5366 ? ?06/29/2021 ? ?Ms. Lindsey Walls, identified by name and date of birth, is a 51 y.o. female with a diagnosis of Diabetes: Type 2.  ? ?ASSESSMENT ? ?Blood pressure 132/90, height 5' 5.5" (1.664 m), weight (!) 307 lb 9.6 oz (139.5 kg), last menstrual period 10/12/2012. ?Body mass index is 50.41 kg/m?. ? ? Diabetes Self-Management Education - 06/29/21 1256   ? ?  ? Visit Information  ? Visit Type First/Initial   ?  ? Initial Visit  ? Diabetes Type Type 2   ? Are you currently following a meal plan? No   ? Are you taking your medications as prescribed? Yes   ? Date Diagnosed she reports 1 month ago but A1C was 6.9% in Jan 2022   ?  ? Health Coping  ? How would you rate your overall health? Good   ?  ? Psychosocial Assessment  ? Patient Belief/Attitude about Diabetes Motivated to manage diabetes   "sad"  ? Self-care barriers None   ? Self-management support Doctor's office;Friends;Family   ? Patient Concerns Nutrition/Meal planning;Medication;Glycemic Control;Weight Control;Monitoring;Healthy Lifestyle   ? Special Needs None   ? Preferred Learning Style Hands on   ? Learning Readiness Change in progress   ? How often do you need to have someone help you when you read instructions, pamphlets, or other written materials from your doctor or pharmacy? 1 - Never   ? What is the last grade level you completed in school? high school   ?  ? Pre-Education Assessment  ? Patient understands the diabetes disease and treatment process. Needs Instruction   ? Patient understands incorporating nutritional management into lifestyle. Needs Instruction   ? Patient undertands incorporating physical activity into lifestyle. Needs Instruction   ? Patient understands using medications safely. Needs Instruction   ? Patient understands monitoring blood glucose, interpreting and using results Needs Review   ? Patient understands  prevention, detection, and treatment of acute complications. Needs Instruction   ? Patient understands prevention, detection, and treatment of chronic complications. Needs Review   ? Patient understands how to develop strategies to address psychosocial issues. Needs Instruction   ? Patient understands how to develop strategies to promote health/change behavior. Needs Instruction   ?  ? Complications  ? Last HgB A1C per patient/outside source 10.9 %   06/08/2021  ? How often do you check your blood sugar? 1-2 times/day   ? Fasting Blood glucose range (mg/dL) >200   Pt reports fasting blood sugars in the 200's mg/dL.  ? Have you had a dilated eye exam in the past 12 months? Yes   ? Have you had a dental exam in the past 12 months? Yes   ? Are you checking your feet? No   ?  ? Dietary Intake  ? Breakfast boiled egg, [place] sausage and sometimes fruit (melon, cantaloup)   ? Snack (morning) nuts   ? Lunch [place] or bologna sandwich; soup; salad with lettuce, cuccumbers, eggs, cheese, sunflower seeds, ranch dressing   ? Dinner chicken occasional beef; decreased amount of pasta and potatoes, peas, turnip greens, squash, zucchini, some green beans   ? Beverage(s) water, unsweetened tea, diet soda, SF lemonade   ?  ? Exercise  ? Exercise Type Light (walking / raking leaves)   ? How many days per week to you exercise? 7   ? How many minutes per day do you exercise?  20   ? Total minutes per week of exercise 140   ?  ? Patient Education  ? Previous Diabetes Education No   ? Disease state  Definition of diabetes, type 1 and 2, and the diagnosis of diabetes;Factors that contribute to the development of diabetes   ? Nutrition management  Role of diet in the treatment of diabetes and the relationship between the three main macronutrients and blood glucose level;Food label reading, portion sizes and measuring food.;Reviewed blood glucose goals for pre and post meals and how to evaluate the patients' food intake on their blood glucose  level.;Meal timing in regards to the patients' current diabetes medication.   ? Physical activity and exercise  Role of exercise on diabetes management, blood pressure control and cardiac health.   ? Medications Reviewed patients medication for diabetes, action, purpose, timing of dose and side effects.;Other (comment)   Discussed other treatments for diabetes including injectables  ? Monitoring Purpose and frequency of SMBG.;Taught/discussed recording of test results and interpretation of SMBG.;Identified appropriate SMBG and/or A1C goals.;Daily foot exams   ? Chronic complications Relationship between chronic complications and blood glucose control   ? Psychosocial adjustment Identified and addressed patients feelings and concerns about diabetes   ?  ? Individualized Goals (developed by patient)  ? Reducing Risk Other (comment)   improve blood sugars, decrease medications, prevent diabetes complications, lose weight, lead a healthier lifestyle  ?  ? Outcomes  ? Expected Outcomes Demonstrated interest in learning. Expect positive outcomes   ? Future DMSE 4-6 wks   ? ?  ?  ?Individualized Plan for Diabetes Self-Management Training:  ? ?Learning Objective:  Patient will have a greater understanding of diabetes self-management. ?Patient education plan is to attend individual and/or group sessions per assessed needs and concerns. ?  ?Plan:  ? ?Patient Instructions  ?Check blood sugars before breakfast or 2 hrs after one meal every day ?Bring blood sugar records to the next appointment ? ?Continue walking   for    5000 steps daily   gradually increase to 150 minutes of walking per week ? ?Eat 3 meals day,   1-2  snacks a day ?Space meals 4-6 hours apart ?Eat at least eat 1 protein and 1 carbohydrate instead of skipping ?Complete 3 Day Food Record and bring to next appointment ? ?Return for appointment on:  Friday July 27, 2021  at 8:30 am with Freda Munro (nurse) ? ?Expected Outcomes:  Demonstrated interest in learning.  Expect positive outcomes ? ?Education material provided:  ?Metallurgist Guidelines ?Simple Meal Plan ?3 Day Food Record ?Healthy Snack Choices (ADA) ? ?If problems or questions, patient to contact team via:   ?Johny Drilling, RN, [place], [place] [phone] ? ?Future DSME appointment: 4-6 wks ?July 27, 2021 with this nurse ?

## 2021-07-04 ENCOUNTER — Other Ambulatory Visit: Payer: Self-pay

## 2021-07-04 ENCOUNTER — Telehealth: Payer: Self-pay | Admitting: Internal Medicine

## 2021-07-04 DIAGNOSIS — E119 Type 2 diabetes mellitus without complications: Secondary | ICD-10-CM

## 2021-07-04 MED ORDER — BLOOD GLUCOSE METER KIT: PACK | 0 refills | Status: DC

## 2021-07-04 MED ORDER — CONTOUR NEXT TEST VI STRP: ORAL_STRIP | 12 refills | Status: DC

## 2021-07-04 NOTE — Progress Notes (Signed)
Sent order for test strips  ?

## 2021-07-04 NOTE — Telephone Encounter (Signed)
Pt is requesting a meter is this okay , I didn't see were she has has had one in the past . How often  did you want her her test if this is okay ? Please advise ?

## 2021-07-04 NOTE — Progress Notes (Signed)
Sent in rx for glucose meter and test strips to the pharmacy per patient request.  ?

## 2021-07-04 NOTE — Telephone Encounter (Signed)
Pt wondering if she can get a prescription called in for CONTOUR NEXT glucose test strips so she does not have to continue paying OTC, please advise  ? ?[place], [facility] ?

## 2021-07-04 NOTE — Telephone Encounter (Signed)
Sent order to the pharmacy call and informed pt that the order was sent . Per pt  request.  ?

## 2021-07-05 ENCOUNTER — Telehealth: Payer: Self-pay | Admitting: Internal Medicine

## 2021-07-05 MED ORDER — ACCU-CHEK GUIDE W/DEVICE KIT: 1.0000 | PACK | Freq: Once | 0 refills | Status: AC

## 2021-07-05 MED ORDER — ACCU-CHEK SOFTCLIX LANCET DEV KIT: 1.0000 | PACK | Freq: Once | 0 refills | Status: AC

## 2021-07-05 MED ORDER — ACCU-CHEK GUIDE VI STRP: ORAL_STRIP | 12 refills | Status: DC

## 2021-07-05 MED ORDER — ACCU-CHEK SOFTCLIX LANCETS MISC: 4 refills | Status: DC

## 2021-07-05 NOTE — Telephone Encounter (Signed)
Wells Guiles with [facility] called stating that insurance want pay for medication glucose blood (CONTOUR NEXT TEST) test strip, but they will pay for Accu Chek Guide. Wells Guiles is asking is it ok to substitute Wells Guiles also states that pt will need lancets. Please advise. ?

## 2021-07-05 NOTE — Telephone Encounter (Signed)
Rxs sent electronically.  

## 2021-07-10 ENCOUNTER — Other Ambulatory Visit: Payer: Self-pay | Admitting: Internal Medicine

## 2021-07-11 MED ORDER — LISINOPRIL-HYDROCHLOROTHIAZIDE 20-25 MG PO TABS: ORAL_TABLET | ORAL | 3 refills | Status: DC

## 2021-07-23 ENCOUNTER — Other Ambulatory Visit: Payer: Self-pay

## 2021-07-23 ENCOUNTER — Encounter: Payer: Self-pay | Admitting: Internal Medicine

## 2021-07-23 MED ORDER — METFORMIN HCL ER 500 MG PO TB24: 1000.0000 mg | ORAL_TABLET | Freq: Every day | ORAL | 2 refills | Status: DC

## 2021-07-27 ENCOUNTER — Encounter: Payer: 59 | Attending: Internal Medicine | Admitting: *Deleted

## 2021-07-27 ENCOUNTER — Encounter: Payer: Self-pay | Admitting: *Deleted

## 2021-07-27 VITALS — BP 122/82 | Wt 301.5 lb

## 2021-07-27 DIAGNOSIS — E1165 Type 2 diabetes mellitus with hyperglycemia: Secondary | ICD-10-CM

## 2021-07-27 DIAGNOSIS — Z713 Dietary counseling and surveillance: Secondary | ICD-10-CM | POA: Insufficient documentation

## 2021-07-27 DIAGNOSIS — Z6841 Body Mass Index (BMI) 40.0 and over, adult: Secondary | ICD-10-CM | POA: Diagnosis not present

## 2021-07-27 DIAGNOSIS — E119 Type 2 diabetes mellitus without complications: Secondary | ICD-10-CM | POA: Diagnosis present

## 2021-07-27 NOTE — Progress Notes (Signed)
Diabetes Self-Management Education ? ?Visit Type: Follow-up ? ?Appt. Start Time: 0835 Appt. End Time: 3329 ? ?07/27/2021 ? ?Ms. Lindsey Walls, identified by name and date of birth, is a 51 y.o. female with a diagnosis of Diabetes: Type 2.  ? ?ASSESSMENT ? ?Blood pressure 122/82, weight (!) 301 lb 8 oz (136.8 kg), last menstrual period 10/12/2012. ?Body mass index is 49.41 kg/m?. ? ? Diabetes Self-Management Education - 07/27/21 0933   ? ?  ? Visit Information  ? Visit Type Follow-up   ?  ? Initial Visit  ? Diabetes Type Type 2   ?  ? Complications  ? How often do you check your blood sugar? 1-2 times/day   ? Fasting Blood glucose range (mg/dL) 130-179;180-200   last 3 weeks - FBG's 144-187 mg/dL  ? Postprandial Blood glucose range (mg/dL) >200   1 reading pp in the last 3 weeks - 216 mg/dL  ? Have you had a dilated eye exam in the past 12 months? Yes   ? Have you had a dental exam in the past 12 months? Yes   ? Are you checking your feet? No   ?  ? Dietary Intake  ? Breakfast low carb meals - eats 2-3 meals/day - trying to have 1 protein and 1 carbohydrate serving instead of skipping (see Food Record for 1 day)   ? Snack (morning) 0-1 snacks/day - almonds, fruit - melon with cottage cheese   ?  ? Exercise  ? Exercise Type Light (walking / raking leaves)   ? How many days per week to you exercise? 7   ? How many minutes per day do you exercise? 25   ? Total minutes per week of exercise 175   ?  ? Patient Education  ? Disease state  Definition of diabetes, type 1 and 2, and the diagnosis of diabetes;Factors that contribute to the development of diabetes;Explored patient's options for treatment of their diabetes   ? Nutrition management  Role of diet in the treatment of diabetes and the relationship between the three main macronutrients and blood glucose level;Food label reading, portion sizes and measuring food.;Carbohydrate counting;Reviewed blood glucose goals for pre and post meals and how to evaluate the patients'  food intake on their blood glucose level.;Meal timing in regards to the patients' current diabetes medication.   ? Physical activity and exercise  Role of exercise on diabetes management, blood pressure control and cardiac health.   ? Medications Reviewed patients medication for diabetes, action, purpose, timing of dose and side effects.   ? Monitoring Purpose and frequency of SMBG.;Taught/discussed recording of test results and interpretation of SMBG.;Identified appropriate SMBG and/or A1C goals.;Daily foot exams   ? Chronic complications Relationship between chronic complications and blood glucose control   ? Psychosocial adjustment Role of stress on diabetes;Identified and addressed patients feelings and concerns about diabetes   ?  ? Individualized Goals (developed by patient)  ? Nutrition Follow meal plan discussed   ? Physical Activity Exercise 5-7 days per week;30 minutes per day   ? Monitoring  test my blood glucose as discussed   ? Reducing Risk examine blood glucose patterns   ?  ? Post-Education Assessment  ? Patient understands the diabetes disease and treatment process. Demonstrates understanding / competency   ? Patient understands incorporating nutritional management into lifestyle. Demonstrates understanding / competency   ? Patient undertands incorporating physical activity into lifestyle. Demonstrates understanding / competency   ? Patient understands using medications safely. Demonstrates understanding /  competency   ? Patient understands monitoring blood glucose, interpreting and using results Demonstrates understanding / competency   ? Patient understands prevention, detection, and treatment of acute complications. Needs Review   ? Patient understands prevention, detection, and treatment of chronic complications. Demonstrates understanding / competency   ? Patient understands how to develop strategies to address psychosocial issues. Demonstrates understanding / competency   ? Patient understands  how to develop strategies to promote health/change behavior. Demonstrates understanding / competency   ?  ? Outcomes  ? Expected Outcomes Demonstrated interest in learning. Expect positive outcomes   ? Program Status Completed   ?  ? Subsequent Visit  ? Since your last visit have you continued or begun to take your medications as prescribed? Yes   ? Since your last visit have you had your blood pressure checked? No   ? Since your last visit have you experienced any weight changes? Loss   ? Weight Loss (lbs) 6.1   ? Since your last visit, are you checking your blood glucose at least once a day? Yes   ? ?  ?  ? ?Individualized Plan for Diabetes Self-Management Training:  ? ?Learning Objective:  Patient will have a greater understanding of diabetes self-management. ?Patient education plan is to attend individual and/or group sessions per assessed needs and concerns. ?  ?Plan:  ? ?Patient Instructions  ?Check blood sugars 1-2 x day before breakfast and/or 2 hrs after one meal ? ?Exercise:  Continue walking for    10  minutes   2-3 times per day and gradually increase to 150 minutes a week  ? ?Eat 3 meals day,   1  snack a day ?Space meals 4-6 hours apart ?Don't skip meals - continue to eat at least 1 protein and 1 carbohydrate serving ? ?Expected Outcomes:  Demonstrated interest in learning. Expect positive outcomes ? ?Education material provided:  ?Planning a Balanced Meal ?Quick and Balanced Meals ?Healthy Snack Choices (ADA) ?Diabetes Plate Method (ADA) ?Carb-Mindful Smoothie Handout ? ?If problems or questions, patient to contact team via:   ?Johny Drilling, RN, [place], [place] [phone] ? ?Future DSME appointment: PRN ?

## 2021-07-27 NOTE — Patient Instructions (Signed)
Check blood sugars 1-2 x day before breakfast and/or 2 hrs after one meal ? ?Exercise:  Continue walking for    10  minutes   2-3 times per day and gradually increase to 150 minutes a week  ? ?Eat 3 meals day,   1  snack a day ?Space meals 4-6 hours apart ?Don't skip meals - continue to eat at least 1 protein and 1 carbohydrate serving ? ?

## 2021-08-20 ENCOUNTER — Other Ambulatory Visit: Payer: Self-pay | Admitting: Internal Medicine

## 2021-08-20 DIAGNOSIS — M545 Low back pain, unspecified: Secondary | ICD-10-CM

## 2021-08-27 ENCOUNTER — Encounter: Payer: Self-pay | Admitting: Internal Medicine

## 2021-08-27 MED ORDER — METFORMIN HCL ER 500 MG PO TB24: ORAL_TABLET | ORAL | 11 refills | Status: DC

## 2021-09-26 ENCOUNTER — Encounter: Payer: Self-pay | Admitting: Internal Medicine

## 2021-09-26 ENCOUNTER — Ambulatory Visit (INDEPENDENT_AMBULATORY_CARE_PROVIDER_SITE_OTHER): Payer: BC Managed Care – PPO | Admitting: Internal Medicine

## 2021-09-26 VITALS — BP 118/76 | HR 66 | Temp 97.7°F | Ht 65.25 in | Wt 295.0 lb

## 2021-09-26 DIAGNOSIS — I10 Essential (primary) hypertension: Secondary | ICD-10-CM | POA: Diagnosis not present

## 2021-09-26 DIAGNOSIS — Z1211 Encounter for screening for malignant neoplasm of colon: Secondary | ICD-10-CM

## 2021-09-26 DIAGNOSIS — E1159 Type 2 diabetes mellitus with other circulatory complications: Secondary | ICD-10-CM | POA: Insufficient documentation

## 2021-09-26 DIAGNOSIS — Z0000 Encounter for general adult medical examination without abnormal findings: Secondary | ICD-10-CM | POA: Diagnosis not present

## 2021-09-26 DIAGNOSIS — E785 Hyperlipidemia, unspecified: Secondary | ICD-10-CM | POA: Diagnosis not present

## 2021-09-26 LAB — COMPREHENSIVE METABOLIC PANEL
ALT: 88 U/L — ABNORMAL HIGH (ref 0–35)
AST: 48 U/L — ABNORMAL HIGH (ref 0–37)
Albumin: 4.2 g/dL (ref 3.5–5.2)
Alkaline Phosphatase: 55 U/L (ref 39–117)
BUN: 13 mg/dL (ref 6–23)
CO2: 32 mEq/L (ref 19–32)
Calcium: 9.9 mg/dL (ref 8.4–10.5)
Chloride: 104 mEq/L (ref 96–112)
Creatinine, Ser: 0.77 mg/dL (ref 0.40–1.20)
GFR: 88.6 mL/min (ref >60.00)
Glucose, Bld: 114 mg/dL — ABNORMAL HIGH (ref 70–99)
Potassium: 4 mEq/L (ref 3.5–5.1)
Sodium: 142 mEq/L (ref 135–145)
Total Bilirubin: 0.5 mg/dL (ref 0.2–1.2)
Total Protein: 7.2 g/dL (ref 6.0–8.3)

## 2021-09-26 LAB — MICROALBUMIN / CREATININE URINE RATIO
Creatinine,U: 119 mg/dL
Microalb Creat Ratio: 1.3 mg/g (ref 0.0–30.0)
Microalb, Ur: 1.5 mg/dL (ref 0.0–1.9)

## 2021-09-26 LAB — LIPID PANEL
Cholesterol: 225 mg/dL — ABNORMAL HIGH (ref 0–200)
HDL: 50.2 mg/dL (ref >39.00)
LDL Cholesterol: 151 mg/dL — ABNORMAL HIGH (ref 0–99)
NonHDL: 174.72
Total CHOL/HDL Ratio: 4
Triglycerides: 117 mg/dL (ref 0.0–149.0)
VLDL: 23.4 mg/dL (ref 0.0–40.0)

## 2021-09-26 LAB — CBC
HCT: 39.7 % (ref 36.0–46.0)
Hemoglobin: 12.5 g/dL (ref 12.0–15.0)
MCHC: 31.6 g/dL (ref 30.0–36.0)
MCV: 82.7 fl (ref 78.0–100.0)
Platelets: 317 10*3/uL (ref 150.0–400.0)
RBC: 4.79 Mil/uL (ref 3.87–5.11)
RDW: 13.4 % (ref 11.5–15.5)
WBC: 8.2 10*3/uL (ref 4.0–10.5)

## 2021-09-26 LAB — HM DIABETES FOOT EXAM

## 2021-09-26 LAB — T4, FREE: Free T4: 0.76 ng/dL (ref 0.60–1.60)

## 2021-09-26 LAB — HEMOGLOBIN A1C: Hgb A1c MFr Bld: 7.6 % — ABNORMAL HIGH (ref 4.6–6.5)

## 2021-09-26 MED ORDER — ROSUVASTATIN CALCIUM 10 MG PO TABS: 10.0000 mg | ORAL_TABLET | Freq: Every day | ORAL | 3 refills | Status: DC

## 2021-09-26 NOTE — Assessment & Plan Note (Signed)
She is willing to try statin

## 2021-09-26 NOTE — Assessment & Plan Note (Addendum)
Doing well on metformin 1000/500 Prefers to hold off on semaglutide Will check labs Eye exam Controlled HTN  Prefers to hold off on statin

## 2021-09-26 NOTE — Progress Notes (Signed)
Subjective:    Patient ID: Lindsey Walls Seen, female    DOB: 06-30-1970, 51 y.o.   MRN: [id number]  HPI Here for physical  Sugars are much better Most under 140 and often much lower Did go for the diabetic counseling Has lost 20# total since 2 years ago  Has been exercising---works from home but makes an effort to walk regularly  Depression is better Some anxiety spells in the past---much better now Meditation and yoga help Current Outpatient Medications on File Prior to Visit  Medication Sig Dispense Refill   Accu-Chek Softclix Lancets lancets Use to check blood sugar daily 100 each 4   blood glucose meter kit and supplies Dispense based on patient and insurance preference. Use up to four times daily as directed. (FOR ICD-10 E10.9, E11.9). 1 each 0   Glucosamine-Chondroitin-MSM 1500-1200-500 MG PACK Take 1 tablet by mouth 2 (two) times daily.     glucose blood (ACCU-CHEK GUIDE) test strip Use as instructed 100 each 12   lisinopril-hydrochlorothiazide (ZESTORETIC) 20-25 MG tablet 1 90 tablet 3   metFORMIN (GLUCOPHAGE-XR) 500 MG 24 hr tablet Take 2 tablets by mouth in the am and 1 in pm 90 tablet 11   PROAIR HFA 108 (90 Base) MCG/ACT inhaler Inhale 2 puffs into the lungs every 6 (six) hours as needed for wheezing or shortness of breath. 18 g 0   Turmeric 500 MG CAPS Take 1 capsule by mouth daily.     No current facility-administered medications on file prior to visit.    No Known Allergies  Past Medical History:  Diagnosis Date   Anemia    Bronchitis, chronic (HCC)    Chronic back pain    COVID-19 12/2020   Diabetes mellitus without complication ([place])    Fibroid    Hyperlipidemia    Hypertension    Obesity    Obesity     Past Surgical History:  Procedure Laterality Date   ABDOMINAL HYSTERECTOMY N/A 11/23/2012   Procedure: HYSTERECTOMY ABDOMINAL with repair of laproscopic torcar incision; lysis of adhesions ;  Surgeon: Daria Pastures, MD;  Location: [facility];   Service: Gynecology;  Laterality: N/A;   CESAREAN SECTION     CHOLECYSTECTOMY     CYSTOSCOPY N/A 11/23/2012   Procedure: CYSTOSCOPY;  Surgeon: Daria Pastures, MD;  Location: [facility];  Service: Gynecology;  Laterality: N/A;   ENDOMETRIAL ABLATION  08/08   OMENTECTOMY N/A 11/23/2012   Procedure: OMENTECTOMY;  Surgeon: Daria Pastures, MD;  Location: [facility];  Service: Gynecology;  Laterality: N/A;   ROBOTIC ASSISTED TOTAL HYSTERECTOMY N/A 11/23/2012   Procedure: ATTEMPTED ROBOTIC ASSISTED TOTAL HYSTERECTOMY;  Surgeon: Daria Pastures, MD;  Location: [facility];  Service: Gynecology;  Laterality: N/A;   TIBIA FRACTURE SURGERY      Family History  Problem Relation Age of Onset   Diabetes Mother    Coronary artery disease Mother    Hypertension Father    Cancer Father        lung   COPD Father    Diabetes Sister    Coronary artery disease Sister    Diabetes Maternal Aunt     Social History   Socioeconomic History   Marital status: Single    Spouse name: Not on file   Number of children: 2   Years of education: Not on file   Highest education level: Not on file  Occupational History   Occupation: Auditor--remote work    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, [employer]  Tobacco Use  Smoking status: Never    Passive exposure: Past   Smokeless tobacco: Never  Vaping Use   Vaping Use: Never used  Substance and Sexual Activity   Alcohol use: No    Alcohol/week: 0.0 standard drinks of alcohol   Drug use: No   Sexual activity: Not on file  Other Topics Concern   Not on file  Social History Narrative   Not on file   Social Determinants of Health   Financial Resource Strain: Not on file  Food Insecurity: Not on file  Transportation Needs: Not on file  Physical Activity: Not on file  Stress: Not on file  Social Connections: Not on file  Intimate Partner Violence: Not on file      Review of Systems  Constitutional:  Negative for fatigue and unexpected weight change.       Wears seat  belt  HENT:  Negative for dental problem, hearing loss and tinnitus.        Keeps up with dentist  Eyes:  Negative for visual disturbance.       No diplopia or unilateral vision loss  Respiratory:  Negative for cough, chest tightness and shortness of breath.   Cardiovascular:  Negative for chest pain.  Gastrointestinal:        Some diarrhea with metformin--not a big deal Some hemorrhoids--no blood recently  Occ heartburn Slight swallowing symptoms---rare  Endocrine: Negative for polydipsia and polyuria.  Genitourinary:  Positive for frequency. Negative for dyspareunia, dysuria and hematuria.  Musculoskeletal:  Negative for arthralgias and joint swelling.       Back is much better now Leg cramps now gone  Skin:  Negative for rash.       Has mole? On right 5th toe  Allergic/Immunologic: Positive for environmental allergies. Negative for immunocompromised state.       Mild --no meds  Neurological:  Negative for dizziness, syncope, light-headedness and headaches.  Hematological:  Negative for adenopathy. Does not bruise/bleed easily.  Psychiatric/Behavioral:  Negative for dysphoric mood and sleep disturbance. The patient is not nervous/anxious.        Objective:   Physical Exam Constitutional:      Appearance: Normal appearance.  HENT:     Mouth/Throat:     Pharynx: No oropharyngeal exudate or posterior oropharyngeal erythema.  Eyes:     Conjunctiva/sclera: Conjunctivae normal.     Pupils: Pupils are equal, round, and reactive to light.  Cardiovascular:     Rate and Rhythm: Normal rate and regular rhythm.     Pulses: Normal pulses.     Heart sounds: No murmur heard.    No gallop.  Pulmonary:     Effort: Pulmonary effort is normal.     Breath sounds: Normal breath sounds. No wheezing or rales.  Abdominal:     Palpations: Abdomen is soft.     Tenderness: There is no abdominal tenderness.  Musculoskeletal:     Cervical back: Neck supple.     Right lower leg: No edema.      Left lower leg: No edema.  Lymphadenopathy:     Cervical: No cervical adenopathy.  Skin:    Findings: No lesion or rash.     Comments: No foot lesions  Neurological:     General: No focal deficit present.     Mental Status: She is alert and oriented to person, place, and time.     Comments: Normal sensation in feet  Psychiatric:        Mood and Affect: Mood normal.  Behavior: Behavior normal.            Assessment & Plan:

## 2021-09-26 NOTE — Assessment & Plan Note (Signed)
Doing better with lifestyle Mammogram due by 3/24 Will do FIT No pap due to hyster Prefers no flu or shingrix Discussed updated COVID vaccine

## 2021-09-26 NOTE — Assessment & Plan Note (Signed)
BP Readings from Last 3 Encounters:  09/26/21 118/76  07/27/21 122/82  06/29/21 132/90   Good control on lisinopril/HCTZ 20/25

## 2021-10-02 ENCOUNTER — Other Ambulatory Visit: Payer: Self-pay

## 2021-10-02 DIAGNOSIS — Z1211 Encounter for screening for malignant neoplasm of colon: Secondary | ICD-10-CM

## 2021-10-04 ENCOUNTER — Other Ambulatory Visit (INDEPENDENT_AMBULATORY_CARE_PROVIDER_SITE_OTHER): Payer: BC Managed Care – PPO

## 2021-10-04 DIAGNOSIS — Z1211 Encounter for screening for malignant neoplasm of colon: Secondary | ICD-10-CM | POA: Diagnosis not present

## 2021-10-04 LAB — FECAL OCCULT BLOOD, IMMUNOCHEMICAL: Fecal Occult Bld: NEGATIVE

## 2021-10-23 ENCOUNTER — Other Ambulatory Visit: Payer: Self-pay | Admitting: Internal Medicine

## 2021-10-23 DIAGNOSIS — Z1231 Encounter for screening mammogram for malignant neoplasm of breast: Secondary | ICD-10-CM

## 2021-10-30 ENCOUNTER — Encounter: Payer: Self-pay | Admitting: Internal Medicine

## 2021-10-31 ENCOUNTER — Ambulatory Visit
Admission: RE | Admit: 2021-10-31 | Discharge: 2021-10-31 | Disposition: A | Discharge status: Home / Self Care | Payer: BC Managed Care – PPO | Source: Ambulatory Visit | Attending: Internal Medicine | Admitting: Internal Medicine

## 2021-10-31 DIAGNOSIS — Z1231 Encounter for screening mammogram for malignant neoplasm of breast: Secondary | ICD-10-CM | POA: Diagnosis not present

## 2021-10-31 MED ORDER — DICLOFENAC SODIUM 75 MG PO TBEC: 75.0000 mg | DELAYED_RELEASE_TABLET | Freq: Two times a day (BID) | ORAL | 1 refills | Status: DC | PRN

## 2021-11-20 ENCOUNTER — Encounter: Payer: Self-pay | Admitting: Internal Medicine

## 2022-02-20 DIAGNOSIS — H35033 Hypertensive retinopathy, bilateral: Secondary | ICD-10-CM | POA: Diagnosis not present

## 2022-02-20 LAB — HM DIABETES EYE EXAM

## 2022-04-02 ENCOUNTER — Encounter: Payer: Self-pay | Admitting: Internal Medicine

## 2022-04-02 ENCOUNTER — Ambulatory Visit (INDEPENDENT_AMBULATORY_CARE_PROVIDER_SITE_OTHER): Payer: BC Managed Care – PPO | Admitting: Internal Medicine

## 2022-04-02 VITALS — BP 122/72 | HR 72 | Temp 97.6°F | Ht 65.25 in | Wt 289.0 lb

## 2022-04-02 DIAGNOSIS — E1159 Type 2 diabetes mellitus with other circulatory complications: Secondary | ICD-10-CM | POA: Diagnosis not present

## 2022-04-02 DIAGNOSIS — I10 Essential (primary) hypertension: Secondary | ICD-10-CM

## 2022-04-02 DIAGNOSIS — E785 Hyperlipidemia, unspecified: Secondary | ICD-10-CM

## 2022-04-02 DIAGNOSIS — E0959 Drug or chemical induced diabetes mellitus with other circulatory complications: Secondary | ICD-10-CM | POA: Diagnosis not present

## 2022-04-02 LAB — POCT GLYCOSYLATED HEMOGLOBIN (HGB A1C): Hemoglobin A1C: 6.7 % — AB (ref 4.0–5.6)

## 2022-04-02 MED ORDER — ROSUVASTATIN CALCIUM 10 MG PO TABS: 10.0000 mg | ORAL_TABLET | ORAL | 0 refills | Status: DC

## 2022-04-02 MED ORDER — ALBUTEROL SULFATE HFA 108 (90 BASE) MCG/ACT IN AERS: 2.0000 | INHALATION_SPRAY | Freq: Four times a day (QID) | RESPIRATORY_TRACT | 0 refills | Status: DC | PRN

## 2022-04-02 NOTE — Assessment & Plan Note (Signed)
BP Readings from Last 3 Encounters:  04/02/22 122/72  09/26/21 118/76  07/27/21 122/82   Controlled on the lisinopril/HCTZ 20/25 No change

## 2022-04-02 NOTE — Addendum Note (Signed)
Addended by: Pilar Grammes on: 04/02/2022 08:48 AM   Modules accepted: Orders

## 2022-04-02 NOTE — Assessment & Plan Note (Signed)
Intolerant of daily rosuvastatin 10---but doing okay with once or twice a week

## 2022-04-02 NOTE — Progress Notes (Signed)
Subjective:    Patient ID: Lindsey Walls, female    DOB: 1970-04-19, 51 y.o.   MRN: [id number]  HPI Here for follow up of diabetes and other chronic health conditions  Doing well Sugars have been very good Has lost some more weight---walking 20-30 minutes five days a week Avoiding all sweet tea and eating healhtier  Did try the rosuvastatin Hasn't felt good with it---dries her skin out Taking once or twice a week only  No chest pain No SOB No dizziness or syncope  Current Outpatient Medications on File Prior to Visit  Medication Sig Dispense Refill   blood glucose meter kit and supplies Dispense based on patient and insurance preference. Use up to four times daily as directed. (FOR ICD-10 E10.9, E11.9). 1 each 0   diclofenac (VOLTAREN) 75 MG EC tablet Take 1 tablet (75 mg total) by mouth 2 (two) times daily as needed. 60 tablet 1   Glucosamine-Chondroitin-MSM 1500-1200-500 MG PACK Take 1 tablet by mouth 2 (two) times daily.     lisinopril-hydrochlorothiazide (ZESTORETIC) 20-25 MG tablet 1 90 tablet 3   metFORMIN (GLUCOPHAGE-XR) 500 MG 24 hr tablet Take 2 tablets by mouth in the am and 1 in pm 90 tablet 11   Turmeric 500 MG CAPS Take 1 capsule by mouth daily.     No current facility-administered medications on file prior to visit.    No Known Allergies  Past Medical History:  Diagnosis Date   Anemia    Bronchitis, chronic (HCC)    Chronic back pain    COVID-19 12/2020   Diabetes mellitus without complication ([place])    Fibroid    Hyperlipidemia    Hypertension    Obesity    Obesity     Past Surgical History:  Procedure Laterality Date   ABDOMINAL HYSTERECTOMY N/A 11/23/2012   Procedure: HYSTERECTOMY ABDOMINAL with repair of laproscopic torcar incision; lysis of adhesions ;  Surgeon: Daria Pastures, MD;  Location: [facility];  Service: Gynecology;  Laterality: N/A;   CESAREAN SECTION     CHOLECYSTECTOMY     CYSTOSCOPY N/A 11/23/2012   Procedure: CYSTOSCOPY;   Surgeon: Daria Pastures, MD;  Location: [facility];  Service: Gynecology;  Laterality: N/A;   ENDOMETRIAL ABLATION  08/08   OMENTECTOMY N/A 11/23/2012   Procedure: OMENTECTOMY;  Surgeon: Daria Pastures, MD;  Location: [facility];  Service: Gynecology;  Laterality: N/A;   ROBOTIC ASSISTED TOTAL HYSTERECTOMY N/A 11/23/2012   Procedure: ATTEMPTED ROBOTIC ASSISTED TOTAL HYSTERECTOMY;  Surgeon: Daria Pastures, MD;  Location: [facility];  Service: Gynecology;  Laterality: N/A;   TIBIA FRACTURE SURGERY      Family History  Problem Relation Age of Onset   Diabetes Mother    Coronary artery disease Mother    Hypertension Father    Cancer Father        lung   COPD Father    Diabetes Sister    Coronary artery disease Sister    Diabetes Maternal Aunt     Social History   Socioeconomic History   Marital status: Single    Spouse name: Not on file   Number of children: 2   Years of education: Not on file   Highest education level: Not on file  Occupational History   Occupation: Auditor--remote work    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, nature  Tobacco Use   Smoking status: Never    Passive exposure: Past   Smokeless tobacco: Never  Vaping Use   Vaping Use: Never  used  Substance and Sexual Activity   Alcohol use: No    Alcohol/week: 0.0 standard drinks of alcohol   Drug use: No   Sexual activity: Not on file  Other Topics Concern   Not on file  Social History Narrative   Not on file   Social Determinants of Health   Financial Resource Strain: Not on file  Food Insecurity: Not on file  Transportation Needs: Not on file  Physical Activity: Not on file  Stress: Not on file  Social Connections: Not on file  Intimate Partner Violence: Not on file   Review of Systems Sleeping well No N/V--just occasional stomach upset with metformin     Objective:   Physical Exam Constitutional:      Appearance: Normal appearance.  Cardiovascular:     Rate and Rhythm: Normal rate and regular rhythm.      Pulses: Normal pulses.     Heart sounds: No murmur heard.    No gallop.  Pulmonary:     Effort: Pulmonary effort is normal.     Breath sounds: Normal breath sounds. No wheezing or rales.  Musculoskeletal:     Cervical back: Neck supple.  Lymphadenopathy:     Cervical: No cervical adenopathy.  Skin:    Findings: No rash.     Comments: 2-50m pigmented nevus on left 5th toe (plantar) ---suggested derm evaluation  Neurological:     Mental Status: She is alert.  Psychiatric:        Mood and Affect: Mood normal.        Behavior: Behavior normal.            Assessment & Plan:

## 2022-04-02 NOTE — Assessment & Plan Note (Signed)
A1c down to 6.7%!! Doing great on metformin 1000/500 No changes needed

## 2022-04-09 ENCOUNTER — Encounter: Payer: Self-pay | Admitting: Internal Medicine

## 2022-04-09 ENCOUNTER — Other Ambulatory Visit: Payer: Self-pay | Admitting: Internal Medicine

## 2022-04-09 DIAGNOSIS — M545 Low back pain, unspecified: Secondary | ICD-10-CM

## 2022-04-09 MED ORDER — NAPROXEN 500 MG PO TABS: 500.0000 mg | ORAL_TABLET | Freq: Two times a day (BID) | ORAL | 1 refills | Status: DC | PRN

## 2022-07-08 ENCOUNTER — Other Ambulatory Visit: Payer: Self-pay | Admitting: Internal Medicine

## 2022-07-22 ENCOUNTER — Encounter: Payer: Self-pay | Admitting: Internal Medicine

## 2022-08-13 ENCOUNTER — Other Ambulatory Visit: Payer: Self-pay | Admitting: Internal Medicine

## 2022-08-13 ENCOUNTER — Encounter: Payer: Self-pay | Admitting: Internal Medicine

## 2022-08-13 ENCOUNTER — Ambulatory Visit (INDEPENDENT_AMBULATORY_CARE_PROVIDER_SITE_OTHER): Payer: BC Managed Care – PPO | Admitting: Internal Medicine

## 2022-08-13 VITALS — BP 124/86 | HR 73 | Temp 97.2°F | Ht 65.25 in | Wt 296.0 lb

## 2022-08-13 DIAGNOSIS — N644 Mastodynia: Secondary | ICD-10-CM

## 2022-08-13 NOTE — Progress Notes (Signed)
Subjective:    Patient ID: Lindsey Walls, female    DOB: 1971/03/19, 52 y.o.   MRN: [id number]  HPI Here due to left breast tenderness  Doesn't feel a lump---but has intermittent tenderness along the inside lower portion of her left breast Started as a pain about a month ago Brief funny sensation at times  No nipple discharge No lumps in axilla  Current Outpatient Medications on File Prior to Visit  Medication Sig Dispense Refill   albuterol (PROAIR HFA) 108 (90 Base) MCG/ACT inhaler Inhale 2 puffs into the lungs every 6 (six) hours as needed for wheezing or shortness of breath. 18 g 0   blood glucose meter kit and supplies Dispense based on patient and insurance preference. Use up to four times daily as directed. (FOR ICD-10 E10.9, E11.9). 1 each 0   lisinopril-hydrochlorothiazide (ZESTORETIC) 20-25 MG tablet TAKE 1 TABLET BY MOUTH EVERY DAY 90 tablet 1   metFORMIN (GLUCOPHAGE-XR) 500 MG 24 hr tablet Take 2 tablets by mouth in the am and 1 in pm 90 tablet 11   naproxen (NAPROSYN) 500 MG tablet Take 1 tablet (500 mg total) by mouth 2 (two) times daily as needed. 60 tablet 1   rosuvastatin (CRESTOR) 10 MG tablet Take 1 tablet (10 mg total) by mouth 2 (two) times a week. 1 tablet 0   Turmeric 500 MG CAPS Take 1 capsule by mouth daily.     No current facility-administered medications on file prior to visit.    No Known Allergies  Past Medical History:  Diagnosis Date   Anemia    Bronchitis, chronic (HCC)    Chronic back pain    COVID-19 12/2020   Diabetes mellitus without complication (HCC)    Fibroid    Hyperlipidemia    Hypertension    Obesity    Obesity     Past Surgical History:  Procedure Laterality Date   ABDOMINAL HYSTERECTOMY N/A 11/23/2012   Procedure: HYSTERECTOMY ABDOMINAL with repair of laproscopic torcar incision; lysis of adhesions ;  Surgeon: Loney Laurence, MD;  Location: [facility];  Service: Gynecology;  Laterality: N/A;   CESAREAN SECTION      CHOLECYSTECTOMY     CYSTOSCOPY N/A 11/23/2012   Procedure: CYSTOSCOPY;  Surgeon: Loney Laurence, MD;  Location: [facility];  Service: Gynecology;  Laterality: N/A;   ENDOMETRIAL ABLATION  08/08   OMENTECTOMY N/A 11/23/2012   Procedure: OMENTECTOMY;  Surgeon: Loney Laurence, MD;  Location: [facility];  Service: Gynecology;  Laterality: N/A;   ROBOTIC ASSISTED TOTAL HYSTERECTOMY N/A 11/23/2012   Procedure: ATTEMPTED ROBOTIC ASSISTED TOTAL HYSTERECTOMY;  Surgeon: Loney Laurence, MD;  Location: [facility];  Service: Gynecology;  Laterality: N/A;   TIBIA FRACTURE SURGERY      Family History  Problem Relation Age of Onset   Diabetes Mother    Coronary artery disease Mother    Hypertension Father    Cancer Father        lung   COPD Father    Diabetes Sister    Coronary artery disease Sister    Diabetes Maternal Aunt     Social History   Socioeconomic History   Marital status: Single    Spouse name: Not on file   Number of children: 2   Years of education: Not on file   Highest education level: Associate degree: occupational, Scientist, product/process development, or vocational program  Occupational History   Occupation: Auditor--remote work    Comment: Herbalist  Tobacco Use  Smoking status: Never    Passive exposure: Past   Smokeless tobacco: Never  Vaping Use   Vaping Use: Never used  Substance and Sexual Activity   Alcohol use: No    Alcohol/week: 0.0 standard drinks of alcohol   Drug use: No   Sexual activity: Not on file  Other Topics Concern   Not on file  Social History Narrative   Not on file   Social Determinants of Health   Financial Resource Strain: Low Risk  (08/09/2022)   Overall Financial Resource Strain (CARDIA)    Difficulty of Paying Living Expenses: Not very hard  Food Insecurity: No Food Insecurity (08/09/2022)   Hunger Vital Sign    Worried About Running Out of Food in the Last Year: Never true    Ran Out of Food in the Last Year: Never true  Transportation Needs: No  Transportation Needs (08/09/2022)   PRAPARE - Administrator, Civil Service (Medical): No    Lack of Transportation (Non-Medical): No  Physical Activity: Insufficiently Active (08/09/2022)   Exercise Vital Sign    Days of Exercise per Week: 7 days    Minutes of Exercise per Session: 20 min  Stress: No Stress Concern Present (08/09/2022)   [employer] of Occupational Health - Occupational Stress Questionnaire    Feeling of Stress : Only a little  Social Connections: Moderately Integrated (08/09/2022)   Social Connection and Isolation Panel [NHANES]    Frequency of Communication with Friends and Family: More than three times a week    Frequency of Social Gatherings with Friends and Family: Three times a week    Attends Religious Services: More than 4 times per year    Active Member of Clubs or Organizations: Yes    Attends Engineer, structural: More than 4 times per year    Marital Status: Divorced  Catering manager Violence: Not on file   Review of Systems Lots of moles under her breasts Not sick     Objective:   Physical Exam Genitourinary:    Comments: Pendulous breasts Cystic changes mostly on left --periareolar and in the area of tenderness Lymphadenopathy:     Upper Body:     Right upper body: No axillary adenopathy.     Left upper body: No axillary adenopathy.            Assessment & Plan:

## 2022-08-13 NOTE — Assessment & Plan Note (Signed)
Likely from cystic changes---but will proceed with diagnostic mammogram just in case Ultrasound also prn

## 2022-08-19 ENCOUNTER — Ambulatory Visit
Admission: RE | Admit: 2022-08-19 | Discharge: 2022-08-19 | Disposition: A | Discharge status: Home / Self Care | Payer: BC Managed Care – PPO | Source: Ambulatory Visit | Attending: Internal Medicine | Admitting: Internal Medicine

## 2022-08-19 DIAGNOSIS — N644 Mastodynia: Secondary | ICD-10-CM

## 2022-08-21 ENCOUNTER — Other Ambulatory Visit: Payer: Self-pay | Admitting: Internal Medicine

## 2022-08-26 ENCOUNTER — Other Ambulatory Visit: Payer: Self-pay | Admitting: Internal Medicine

## 2022-10-14 ENCOUNTER — Encounter: Payer: Self-pay | Admitting: Internal Medicine

## 2022-10-14 ENCOUNTER — Other Ambulatory Visit: Payer: Self-pay | Admitting: Internal Medicine

## 2022-10-14 ENCOUNTER — Ambulatory Visit (INDEPENDENT_AMBULATORY_CARE_PROVIDER_SITE_OTHER): Payer: BC Managed Care – PPO | Admitting: Internal Medicine

## 2022-10-14 VITALS — BP 110/70 | HR 78 | Temp 97.7°F | Ht 65.5 in | Wt 297.0 lb

## 2022-10-14 DIAGNOSIS — Z1231 Encounter for screening mammogram for malignant neoplasm of breast: Secondary | ICD-10-CM

## 2022-10-14 DIAGNOSIS — E1159 Type 2 diabetes mellitus with other circulatory complications: Secondary | ICD-10-CM | POA: Diagnosis not present

## 2022-10-14 DIAGNOSIS — I10 Essential (primary) hypertension: Secondary | ICD-10-CM | POA: Diagnosis not present

## 2022-10-14 DIAGNOSIS — Z7984 Long term (current) use of oral hypoglycemic drugs: Secondary | ICD-10-CM | POA: Diagnosis not present

## 2022-10-14 DIAGNOSIS — Z1211 Encounter for screening for malignant neoplasm of colon: Secondary | ICD-10-CM

## 2022-10-14 DIAGNOSIS — Z0000 Encounter for general adult medical examination without abnormal findings: Secondary | ICD-10-CM | POA: Diagnosis not present

## 2022-10-14 LAB — LIPID PANEL
Cholesterol: 247 mg/dL — ABNORMAL HIGH (ref 0–200)
HDL: 52.9 mg/dL (ref >39.00)
LDL Cholesterol: 166 mg/dL — ABNORMAL HIGH (ref 0–99)
NonHDL: 194.42
Total CHOL/HDL Ratio: 5
Triglycerides: 140 mg/dL (ref 0.0–149.0)
VLDL: 28 mg/dL (ref 0.0–40.0)

## 2022-10-14 LAB — CBC
HCT: 39.5 % (ref 36.0–46.0)
Hemoglobin: 12.6 g/dL (ref 12.0–15.0)
MCHC: 31.9 g/dL (ref 30.0–36.0)
MCV: 82.4 fl (ref 78.0–100.0)
Platelets: 330 10*3/uL (ref 150.0–400.0)
RBC: 4.79 Mil/uL (ref 3.87–5.11)
RDW: 14.2 % (ref 11.5–15.5)
WBC: 10.1 10*3/uL (ref 4.0–10.5)

## 2022-10-14 LAB — COMPREHENSIVE METABOLIC PANEL
ALT: 67 U/L — ABNORMAL HIGH (ref 0–35)
AST: 42 U/L — ABNORMAL HIGH (ref 0–37)
Albumin: 4.2 g/dL (ref 3.5–5.2)
Alkaline Phosphatase: 50 U/L (ref 39–117)
BUN: 15 mg/dL (ref 6–23)
CO2: 29 mEq/L (ref 19–32)
Calcium: 9.7 mg/dL (ref 8.4–10.5)
Chloride: 103 mEq/L (ref 96–112)
Creatinine, Ser: 0.79 mg/dL (ref 0.40–1.20)
GFR: 85.28 mL/min (ref >60.00)
Glucose, Bld: 113 mg/dL — ABNORMAL HIGH (ref 70–99)
Potassium: 3.8 mEq/L (ref 3.5–5.1)
Sodium: 141 mEq/L (ref 135–145)
Total Bilirubin: 0.7 mg/dL (ref 0.2–1.2)
Total Protein: 7.7 g/dL (ref 6.0–8.3)

## 2022-10-14 LAB — HEMOGLOBIN A1C: Hgb A1c MFr Bld: 6.8 % — ABNORMAL HIGH (ref 4.6–6.5)

## 2022-10-14 LAB — HM DIABETES FOOT EXAM

## 2022-10-14 MED ORDER — ROSUVASTATIN CALCIUM 10 MG PO TABS: 10.0000 mg | ORAL_TABLET | ORAL | 3 refills | Status: DC

## 2022-10-14 MED ORDER — BACLOFEN 10 MG PO TABS: 10.0000 mg | ORAL_TABLET | Freq: Three times a day (TID) | ORAL | 1 refills | Status: AC | PRN

## 2022-10-14 NOTE — Progress Notes (Signed)
Subjective:    Patient ID: Lindsey Walls, female    DOB: [birth year], 52 y.o.   MRN: [id number]  HPI Here for physical  Doing well Discouraged by her weight Hasn't been able to lose weight since initial loss on metformin and with improved lifestyle Exercising regularly Checking sugars 3 times a week---never over 130  Some swelling in feet Wears compression socks intermittently ----and this has helped  Current Outpatient Medications on File Prior to Visit  Medication Sig Dispense Refill   albuterol (PROAIR HFA) 108 (90 Base) MCG/ACT inhaler Inhale 2 puffs into the lungs every 6 (six) hours as needed for wheezing or shortness of breath. 18 g 0   lisinopril-hydrochlorothiazide (ZESTORETIC) 20-25 MG tablet TAKE 1 TABLET BY MOUTH EVERY DAY 90 tablet 1   metFORMIN (GLUCOPHAGE-XR) 500 MG 24 hr tablet TAKE 2 TABLETS BY MOUTH EVERY MORNING AND TAKE 1 TABLET BY MOUTH EVERY EVENING 270 tablet 1   naproxen (NAPROSYN) 500 MG tablet Take 1 tablet (500 mg total) by mouth 2 (two) times daily as needed. 60 tablet 1   rosuvastatin (CRESTOR) 10 MG tablet Take 1 tablet (10 mg total) by mouth 2 (two) times a week. 1 tablet 0   Turmeric 500 MG CAPS Take 1 capsule by mouth daily.     No current facility-administered medications on file prior to visit.    No Known Allergies  Past Medical History:  Diagnosis Date   Anemia    Bronchitis, chronic (HCC)    Chronic back pain    COVID-19 12/2020   Diabetes mellitus without complication (HCC)    Fibroid    Hyperlipidemia    Hypertension    Obesity    Obesity     Past Surgical History:  Procedure Laterality Date   ABDOMINAL HYSTERECTOMY N/A 11/23/2012   Procedure: HYSTERECTOMY ABDOMINAL with repair of laproscopic torcar incision; lysis of adhesions ;  Surgeon: Loney Laurence, MD;  Location: [facility];  Service: Gynecology;  Laterality: N/A;   CESAREAN SECTION     CHOLECYSTECTOMY     CYSTOSCOPY N/A 11/23/2012   Procedure: CYSTOSCOPY;  Surgeon:  Loney Laurence, MD;  Location: [facility];  Service: Gynecology;  Laterality: N/A;   ENDOMETRIAL ABLATION  08/08   OMENTECTOMY N/A 11/23/2012   Procedure: OMENTECTOMY;  Surgeon: Loney Laurence, MD;  Location: [facility];  Service: Gynecology;  Laterality: N/A;   ROBOTIC ASSISTED TOTAL HYSTERECTOMY N/A 11/23/2012   Procedure: ATTEMPTED ROBOTIC ASSISTED TOTAL HYSTERECTOMY;  Surgeon: Loney Laurence, MD;  Location: [facility];  Service: Gynecology;  Laterality: N/A;   TIBIA FRACTURE SURGERY      Family History  Problem Relation Age of Onset   Diabetes Mother    Coronary artery disease Mother    Hypertension Father    Cancer Father        lung   COPD Father    Diabetes Sister    Coronary artery disease Sister    Diabetes Maternal Aunt     Social History   Socioeconomic History   Marital status: Single    Spouse name: Not on file   Number of children: 2   Years of education: Not on file   Highest education level: Associate degree: occupational, Scientist, product/process development, or vocational program  Occupational History   Occupation: Auditor--remote work    Comment: Herbalist  Tobacco Use   Smoking status: Never    Passive exposure: Past   Smokeless tobacco: Never  Vaping Use   Vaping Use: Never  used  Substance and Sexual Activity   Alcohol use: No    Alcohol/week: 0.0 standard drinks of alcohol   Drug use: No   Sexual activity: Not on file  Other Topics Concern   Not on file  Social History Narrative   Not on file   Social Determinants of Health   Financial Resource Strain: Low Risk  (08/09/2022)   Overall Financial Resource Strain (CARDIA)    Difficulty of Paying Living Expenses: Not very hard  Food Insecurity: No Food Insecurity (08/09/2022)   Hunger Vital Sign    Worried About Running Out of Food in the Last Year: Never true    Ran Out of Food in the Last Year: Never true  Transportation Needs: No Transportation Needs (08/09/2022)   PRAPARE - Scientist, research (physical sciences) (Medical): No    Lack of Transportation (Non-Medical): No  Physical Activity: Insufficiently Active (08/09/2022)   Exercise Vital Sign    Days of Exercise per Week: 7 days    Minutes of Exercise per Session: 20 min  Stress: No Stress Concern Present (08/09/2022)   [employer] of Occupational Health - Occupational Stress Questionnaire    Feeling of Stress : Only a little  Social Connections: Moderately Integrated (08/09/2022)   Social Connection and Isolation Panel [NHANES]    Frequency of Communication with Friends and Family: More than three times a week    Frequency of Social Gatherings with Friends and Family: Three times a week    Attends Religious Services: More than 4 times per year    Active Member of Clubs or Organizations: Yes    Attends Banker Meetings: More than 4 times per year    Marital Status: Divorced  Catering manager Violence: Not on file   Review of Systems  Constitutional:  Negative for fatigue and unexpected weight change.       Wears seat belt  HENT:  Negative for dental problem, hearing loss and tinnitus.        Keeps up with dentist  Eyes:  Negative for visual disturbance.       No diplopia or unilateral vision loss  Respiratory:  Negative for cough, chest tightness and shortness of breath.   Cardiovascular:  Negative for chest pain and palpitations.  Gastrointestinal:  Negative for blood in stool and constipation.       Bowels loose again---with the metformin (had gotten better than recurred) Increasing protein  Endocrine: Negative for polydipsia and polyuria.  Genitourinary:  Negative for dysuria and hematuria.       Recent yeast infection--better with monistat  Musculoskeletal:  Positive for back pain. Negative for arthralgias and joint swelling.       Rarely uses muscle relaxer ----naproxen prn  Skin:  Negative for rash.  Allergic/Immunologic: Positive for environmental allergies. Negative for immunocompromised state.   Neurological:  Negative for dizziness, syncope, light-headedness and headaches.       No foot numbness or burning  Hematological:  Negative for adenopathy. Does not bruise/bleed easily.  Psychiatric/Behavioral:  Negative for dysphoric mood. The patient is not nervous/anxious.        Does awaken at times at 3-4AM---restless (does get back to sleep) No known apnea       Objective:   Physical Exam Constitutional:      Appearance: Normal appearance.  HENT:     Mouth/Throat:     Pharynx: No oropharyngeal exudate or posterior oropharyngeal erythema.  Eyes:     Conjunctiva/sclera: Conjunctivae normal.  Pupils: Pupils are equal, round, and reactive to light.  Cardiovascular:     Rate and Rhythm: Normal rate and regular rhythm.     Pulses: Normal pulses.     Heart sounds: No murmur heard.    No gallop.  Pulmonary:     Effort: Pulmonary effort is normal.     Breath sounds: Normal breath sounds. No wheezing or rales.  Abdominal:     Palpations: Abdomen is soft.     Tenderness: There is no abdominal tenderness.  Musculoskeletal:     Cervical back: Neck supple.     Right lower leg: No edema.     Left lower leg: No edema.  Lymphadenopathy:     Cervical: No cervical adenopathy.  Skin:    Findings: No rash.     Comments: No foot lesions  Neurological:     General: No focal deficit present.     Mental Status: She is alert and oriented to person, place, and time.     Comments: Normal sensation in feet  Psychiatric:        Mood and Affect: Mood normal.        Behavior: Behavior normal.            Assessment & Plan:

## 2022-10-14 NOTE — Addendum Note (Signed)
Addended by: Shon Millet on: 10/14/2022 09:49 AM   Modules accepted: Orders

## 2022-10-14 NOTE — Assessment & Plan Note (Signed)
BP Readings from Last 3 Encounters:  10/14/22 110/70  08/13/22 124/86  04/02/22 122/72   Controlled on lisinopril/hydrochlorothiazide 20/25 Check labs

## 2022-10-14 NOTE — Assessment & Plan Note (Signed)
Sugars good on metformin 1000/500 Consider decrease to 500 bid if ongoing bowel issues Didn't do well with oral semaglutide

## 2022-10-14 NOTE — Assessment & Plan Note (Signed)
Healthy Doing good work on [employer] increased resistance work Recommended updated COVID and flu vaccines--she isn't sure FIT Mammogram yearly--due soon No pap due to hyster

## 2022-10-17 ENCOUNTER — Other Ambulatory Visit (INDEPENDENT_AMBULATORY_CARE_PROVIDER_SITE_OTHER): Payer: BC Managed Care – PPO | Admitting: *Deleted

## 2022-10-17 DIAGNOSIS — E1159 Type 2 diabetes mellitus with other circulatory complications: Secondary | ICD-10-CM

## 2022-10-17 LAB — MICROALBUMIN / CREATININE URINE RATIO
Creatinine,U: 244.3 mg/dL
Microalb Creat Ratio: 1 mg/g (ref 0.0–30.0)
Microalb, Ur: 2.5 mg/dL — ABNORMAL HIGH (ref 0.0–1.9)

## 2022-10-31 ENCOUNTER — Encounter: Payer: Self-pay | Admitting: Internal Medicine

## 2022-11-02 IMAGING — MG MM DIGITAL SCREENING BILAT W/ TOMO AND CAD
6 of 12 series · 6 of 36 positions shown · non-contrast
Comparison: Previous exam(s).

CLINICAL DATA: Screening.

EXAM:
DIGITAL SCREENING BILATERAL MAMMOGRAM WITH TOMOSYNTHESIS AND CAD
TECHNIQUE: Bilateral screening digital craniocaudal and mediolateral oblique
mammograms were obtained. Bilateral screening digital breast
tomosynthesis was performed. The images were evaluated with
computer-aided detection.

[R MLO synth-2D (1 of 2)]
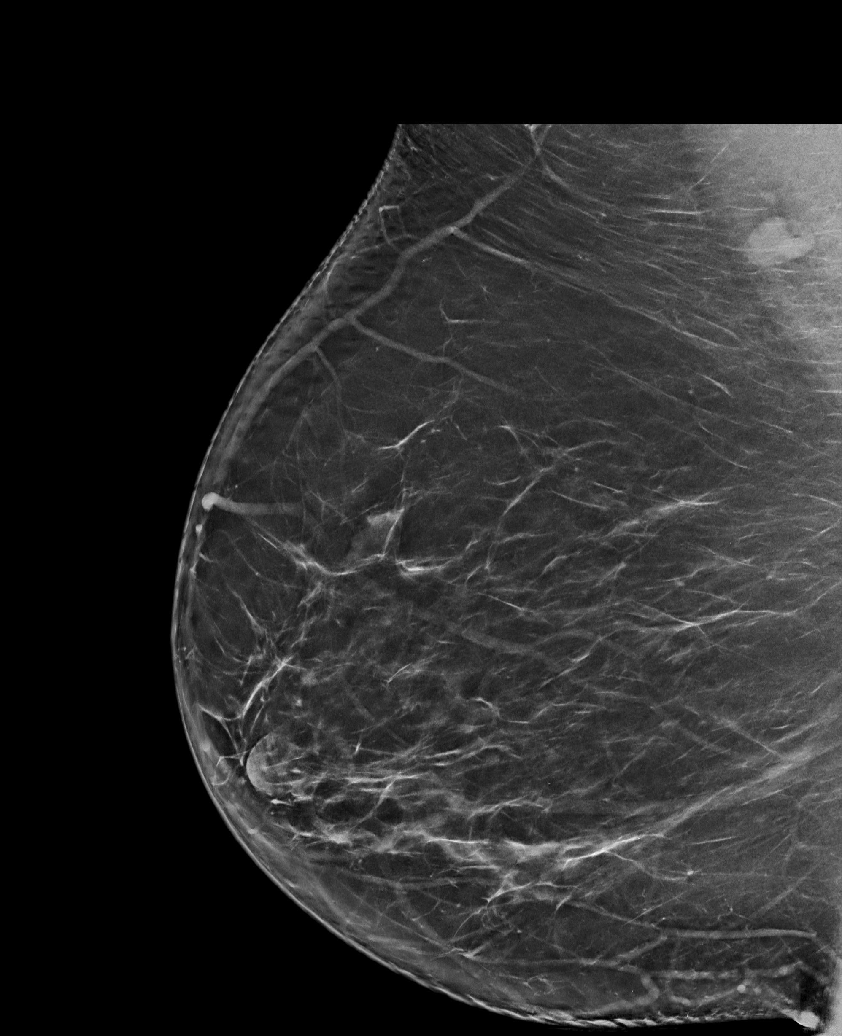

[R CV synth-2D]
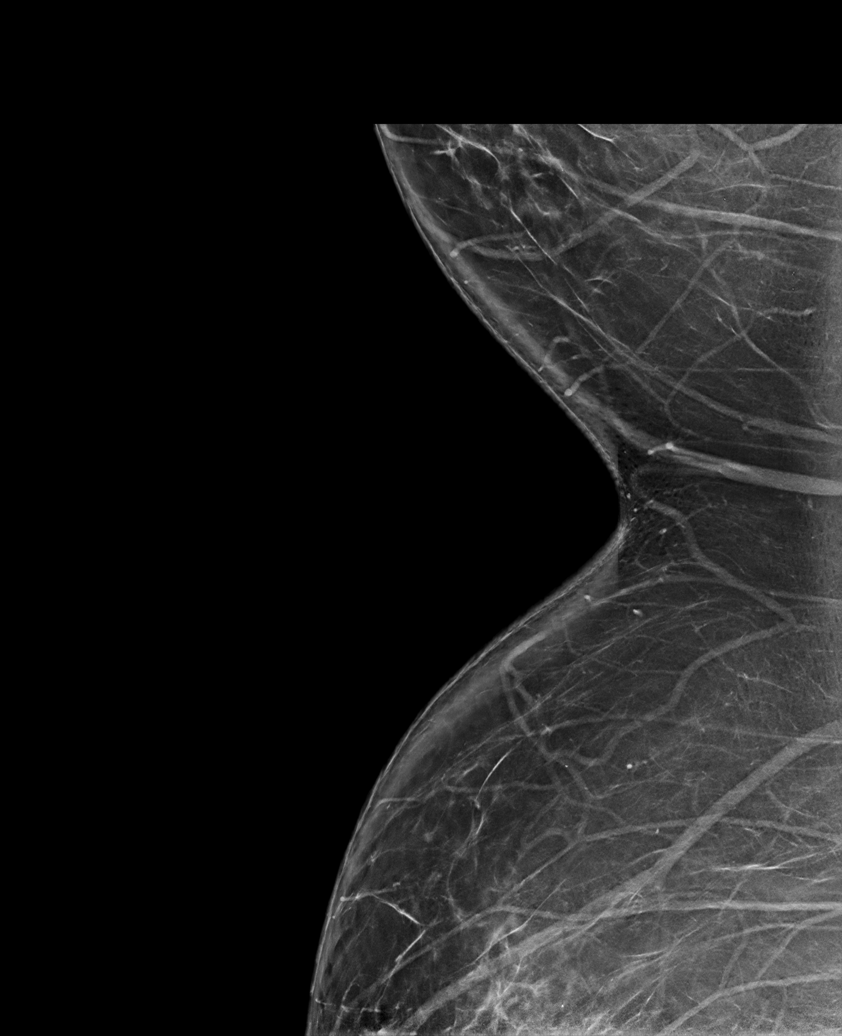

[R CC synth-2D]
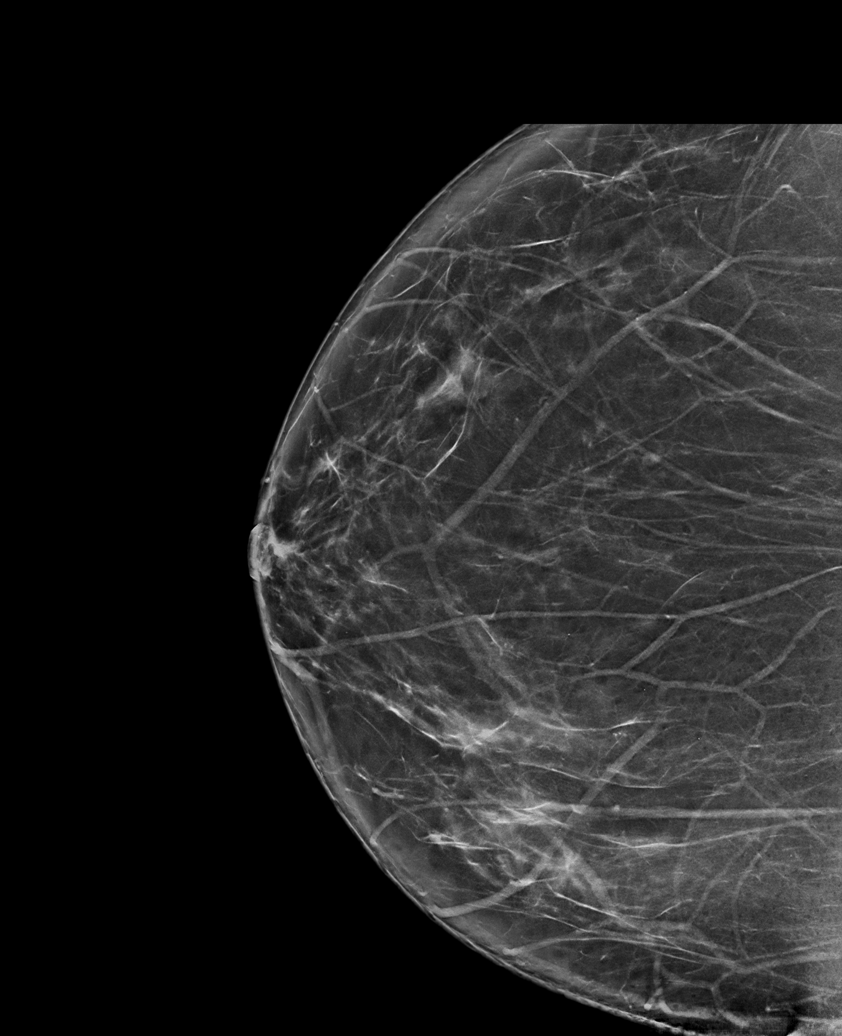

[L MLO synth-2D]
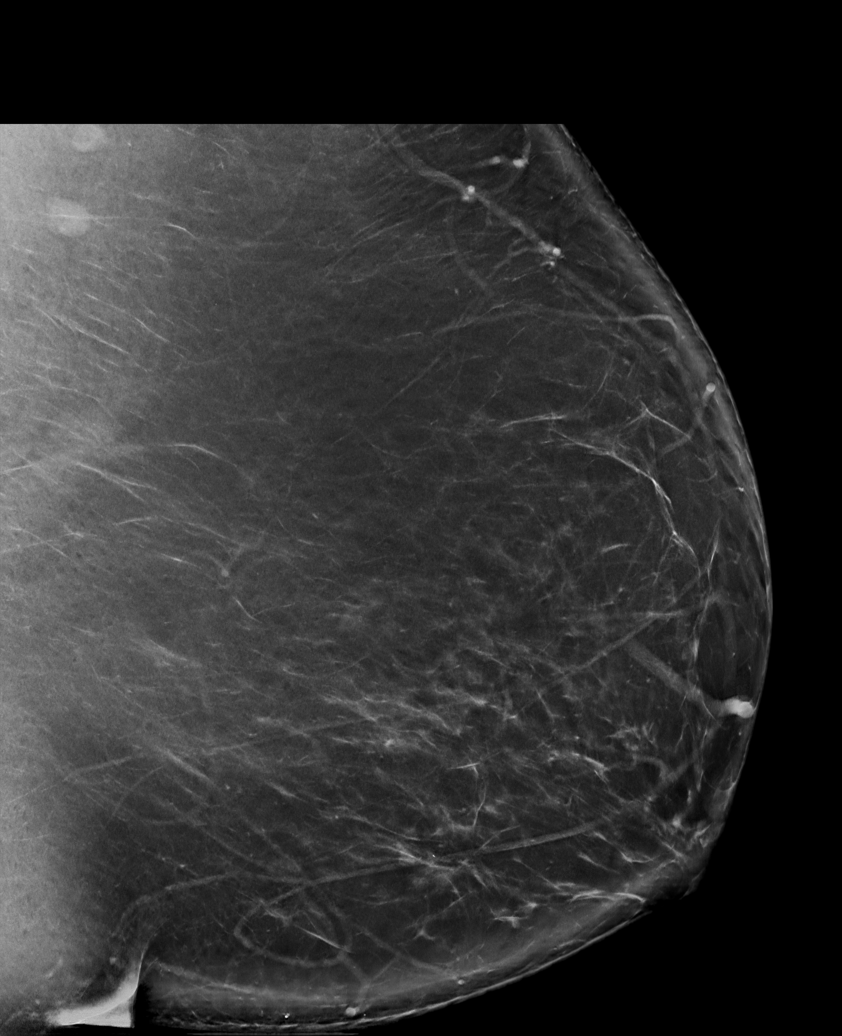

[L CC synth-2D]
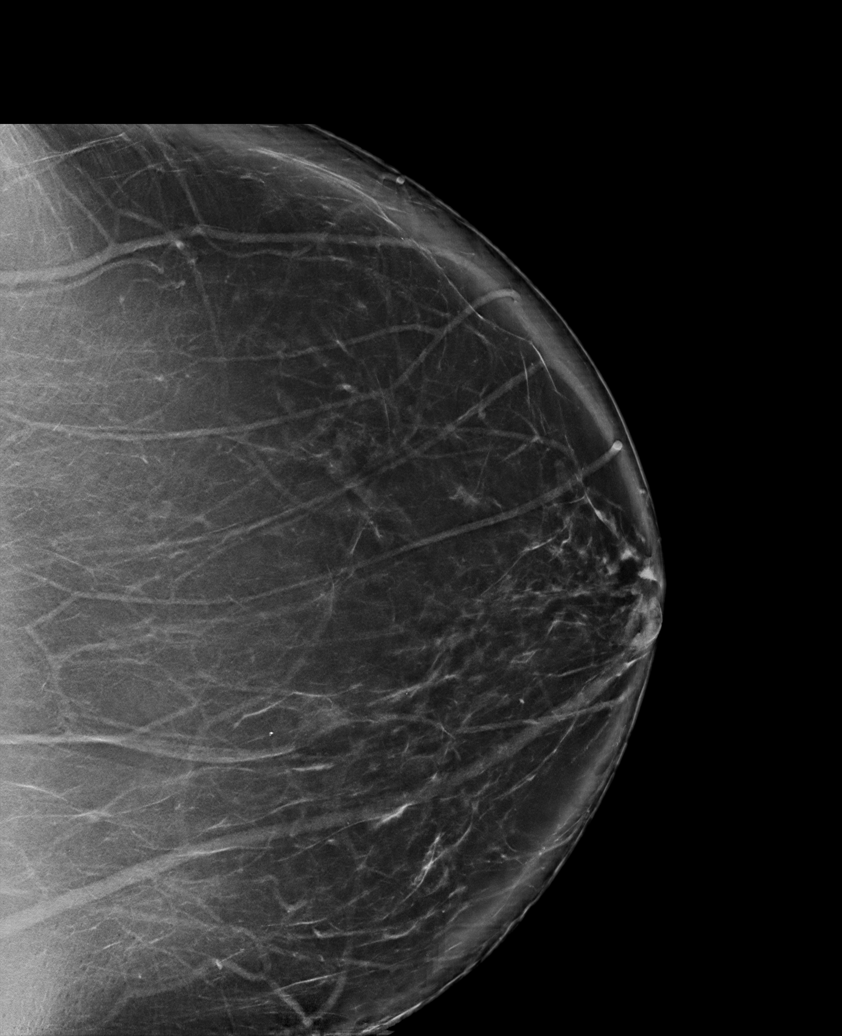

[R MLO synth-2D (2 of 2)]
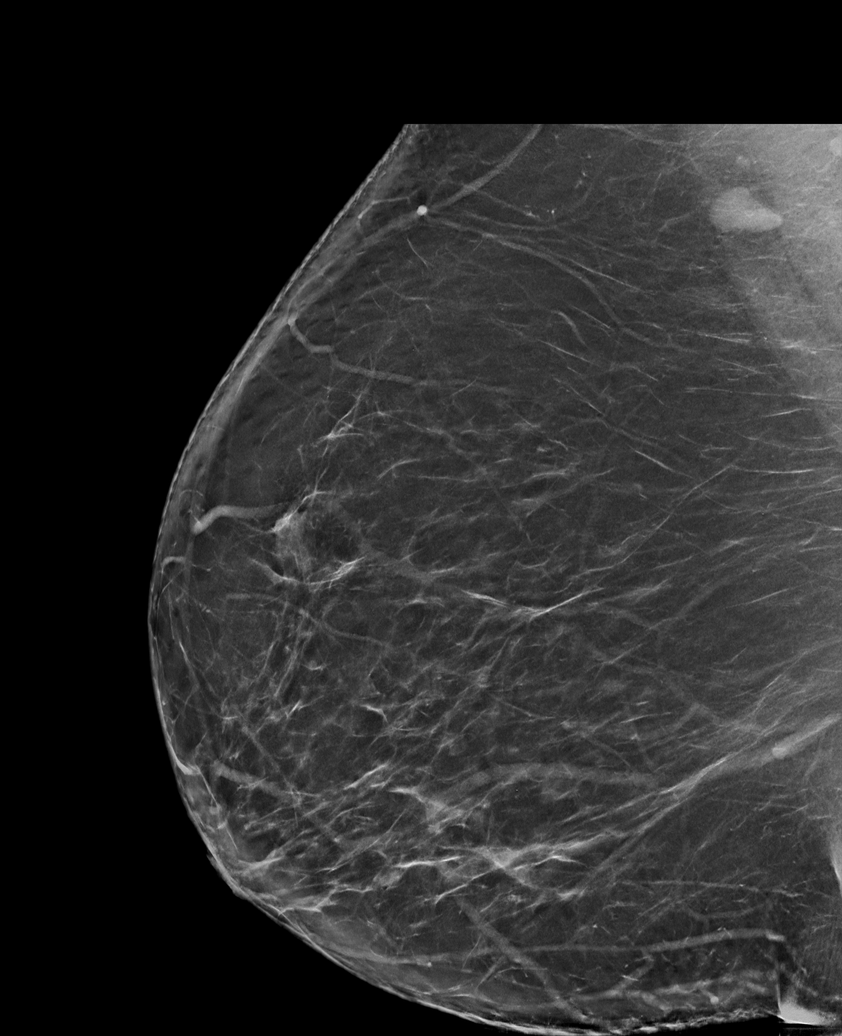

[6 of 36 positions shown; findings below may reference images not displayed]

ACR Breast Density Category b: There are scattered areas of
fibroglandular density.
FINDINGS: There are no findings suspicious for malignancy. The images were
evaluated with computer-aided detection.
IMPRESSION: No mammographic evidence of malignancy. A result letter of this
screening mammogram will be mailed directly to the patient.

RECOMMENDATION:
Screening mammogram in one year. (Code:WJ-I-BG6)

BI-RADS CATEGORY  1: Negative.

## 2022-11-05 ENCOUNTER — Ambulatory Visit
Admission: RE | Admit: 2022-11-05 | Discharge: 2022-11-05 | Disposition: A | Discharge status: Home / Self Care | Payer: BC Managed Care – PPO | Source: Ambulatory Visit | Attending: Internal Medicine | Admitting: Internal Medicine

## 2022-11-05 DIAGNOSIS — Z1231 Encounter for screening mammogram for malignant neoplasm of breast: Secondary | ICD-10-CM

## 2022-11-05 NOTE — Telephone Encounter (Signed)
Form printed out and information added. Form placed in Dr Karle Starch inbox on his desk.

## 2023-01-05 ENCOUNTER — Other Ambulatory Visit: Payer: Self-pay | Admitting: Internal Medicine

## 2023-01-13 ENCOUNTER — Other Ambulatory Visit: Payer: Self-pay | Admitting: Internal Medicine

## 2023-02-11 ENCOUNTER — Encounter: Payer: Self-pay | Admitting: Internal Medicine

## 2023-02-11 MED ORDER — NAPROXEN 500 MG PO TABS: 500.0000 mg | ORAL_TABLET | Freq: Two times a day (BID) | ORAL | 1 refills | Status: DC | PRN

## 2023-02-24 ENCOUNTER — Encounter: Payer: Self-pay | Admitting: Internal Medicine

## 2023-03-26 ENCOUNTER — Encounter: Payer: Self-pay | Admitting: Internal Medicine

## 2023-03-26 MED ORDER — HYDROCODONE BIT-HOMATROP MBR 5-1.5 MG/5ML PO SOLN: 5.0000 mL | Freq: Every evening | ORAL | 0 refills | Status: AC | PRN

## 2023-04-16 ENCOUNTER — Encounter: Payer: Self-pay | Admitting: Internal Medicine

## 2023-04-16 ENCOUNTER — Ambulatory Visit: Payer: BC Managed Care – PPO | Admitting: Internal Medicine

## 2023-04-16 VITALS — BP 110/62 | HR 84 | Temp 98.6°F | Ht 65.5 in | Wt 298.0 lb

## 2023-04-16 DIAGNOSIS — Z7984 Long term (current) use of oral hypoglycemic drugs: Secondary | ICD-10-CM | POA: Diagnosis not present

## 2023-04-16 DIAGNOSIS — E1159 Type 2 diabetes mellitus with other circulatory complications: Secondary | ICD-10-CM

## 2023-04-16 DIAGNOSIS — I10 Essential (primary) hypertension: Secondary | ICD-10-CM

## 2023-04-16 LAB — POCT GLYCOSYLATED HEMOGLOBIN (HGB A1C): Hemoglobin A1C: 6.8 % — AB (ref 4.0–5.6)

## 2023-04-16 MED ORDER — ALBUTEROL SULFATE HFA 108 (90 BASE) MCG/ACT IN AERS: 2.0000 | INHALATION_SPRAY | Freq: Four times a day (QID) | RESPIRATORY_TRACT | 1 refills | Status: AC | PRN

## 2023-04-16 NOTE — Assessment & Plan Note (Signed)
 Lab Results  Component Value Date   HGBA1C 6.8 (A) 04/16/2023   Still with good control on metformin 1000/500 No changes

## 2023-04-16 NOTE — Assessment & Plan Note (Signed)
 BP Readings from Last 3 Encounters:  04/16/23 110/62  10/14/22 110/70  08/13/22 124/86   Fine on lisinopril/hydrochlorothiazide 20/25

## 2023-04-16 NOTE — Progress Notes (Signed)
 Subjective:    Patient ID: Lindsey Walls, female    DOB: 1970/10/05, 53 y.o.   MRN: [id number]  HPI Here for follow up of diabetes and other chronic health conditions  Doing better Respiratory infection is better---still some lingering cough, but out of her chest Mostly just early AM/night Still uses the hydrocodone  rarely Using the inhaler daily  Sugars are good Trying hard with lifestyle Highest was 144 Tried without the evening metformin --but then sugars did go up Some frequent stools at times--but nothing consistent  Has been exercising--walks 4 days per week  Current Outpatient Medications on File Prior to Visit  Medication Sig Dispense Refill   baclofen  (LIORESAL ) 10 MG tablet Take 1 tablet (10 mg total) by mouth 3 (three) times daily as needed for muscle spasms. 30 each 1   HYDROcodone  bit-homatropine (HYCODAN) 5-1.5 MG/5ML syrup Take 5 mLs by mouth at bedtime as needed for cough. 120 mL 0   lisinopril -hydrochlorothiazide  (ZESTORETIC ) 20-25 MG tablet TAKE 1 TABLET BY MOUTH EVERY DAY 90 tablet 3   metFORMIN  (GLUCOPHAGE -XR) 500 MG 24 hr tablet TAKE 2 TABLET BY MOUTH EVERY MORNING AND 1 EVERY EVENING 270 tablet 2   naproxen  (NAPROSYN ) 500 MG tablet Take 1 tablet (500 mg total) by mouth 2 (two) times daily as needed. 60 tablet 1   rosuvastatin  (CRESTOR ) 10 MG tablet Take 1 tablet (10 mg total) by mouth 2 (two) times a week. 24 tablet 3   Turmeric 500 MG CAPS Take 1 capsule by mouth daily.     No current facility-administered medications on file prior to visit.    No Known Allergies  Past Medical History:  Diagnosis Date   Anemia    Bronchitis, chronic (HCC)    Chronic back pain    COVID-19 12/2020   Diabetes mellitus without complication (HCC)    Fibroid    Hyperlipidemia    Hypertension    Obesity    Obesity     Past Surgical History:  Procedure Laterality Date   ABDOMINAL HYSTERECTOMY N/A 11/23/2012   Procedure: HYSTERECTOMY ABDOMINAL with repair of  laproscopic torcar incision; lysis of adhesions ;  Surgeon: Rosaline DELENA Luna, MD;  Location: [facility];  Service: Gynecology;  Laterality: N/A;   CESAREAN SECTION     CHOLECYSTECTOMY     CYSTOSCOPY N/A 11/23/2012   Procedure: CYSTOSCOPY;  Surgeon: Rosaline DELENA Luna, MD;  Location: [facility];  Service: Gynecology;  Laterality: N/A;   ENDOMETRIAL ABLATION  08/08   OMENTECTOMY N/A 11/23/2012   Procedure: OMENTECTOMY;  Surgeon: Rosaline DELENA Luna, MD;  Location: [facility];  Service: Gynecology;  Laterality: N/A;   ROBOTIC ASSISTED TOTAL HYSTERECTOMY N/A 11/23/2012   Procedure: ATTEMPTED ROBOTIC ASSISTED TOTAL HYSTERECTOMY;  Surgeon: Rosaline DELENA Luna, MD;  Location: [facility];  Service: Gynecology;  Laterality: N/A;   TIBIA FRACTURE SURGERY      Family History  Problem Relation Age of Onset   Diabetes Mother    Coronary artery disease Mother    Hypertension Father    Cancer Father        lung   COPD Father    Diabetes Sister    Coronary artery disease Sister    Diabetes Maternal Aunt     Social History   Socioeconomic History   Marital status: Single    Spouse name: Not on file   Number of children: 2   Years of education: Not on file   Highest education level: Associate degree: occupational, scientist, product/process development, or vocational program  Occupational History   Occupation: Auditor--remote work    Comment: Herbalist  Tobacco Use   Smoking status: Never    Passive exposure: Past   Smokeless tobacco: Never  Vaping Use   Vaping status: Never Used  Substance and Sexual Activity   Alcohol use: No    Alcohol/week: 0.0 standard drinks of alcohol   Drug use: No   Sexual activity: Not on file  Other Topics Concern   Not on file  Social History Narrative   Not on file   Social Drivers of Health   Financial Resource Strain: Low Risk  (08/09/2022)   Overall Financial Resource Strain (CARDIA)    Difficulty of Paying Living Expenses: Not very hard  Food Insecurity: No Food Insecurity (08/09/2022)    Hunger Vital Sign    Worried About Running Out of Food in the Last Year: Never true    Ran Out of Food in the Last Year: Never true  Transportation Needs: No Transportation Needs (08/09/2022)   PRAPARE - Administrator, Civil Service (Medical): No    Lack of Transportation (Non-Medical): No  Physical Activity: Insufficiently Active (08/09/2022)   Exercise Vital Sign    Days of Exercise per Week: 7 days    Minutes of Exercise per Session: 20 min  Stress: No Stress Concern Present (08/09/2022)   [employer] of Occupational Health - Occupational Stress Questionnaire    Feeling of Stress : Only a little  Social Connections: Moderately Integrated (08/09/2022)   Social Connection and Isolation Panel [NHANES]    Frequency of Communication with Friends and Family: More than three times a week    Frequency of Social Gatherings with Friends and Family: Three times a week    Attends Religious Services: More than 4 times per year    Active Member of Clubs or Organizations: Yes    Attends Engineer, Structural: More than 4 times per year    Marital Status: Divorced  Catering Manager Violence: Not on file   Review of Systems Weight stable Sleeps okay--does have some night awakening (but no sig daytime somnolence) No known apnea     Objective:   Physical Exam Constitutional:      Appearance: Normal appearance.  Cardiovascular:     Rate and Rhythm: Normal rate and regular rhythm.     Pulses: Normal pulses.     Heart sounds: No murmur heard.    No gallop.  Pulmonary:     Effort: Pulmonary effort is normal.     Breath sounds: Normal breath sounds. No wheezing or rales.  Musculoskeletal:     Cervical back: Neck supple.     Right lower leg: No edema.     Left lower leg: No edema.  Lymphadenopathy:     Cervical: No cervical adenopathy.  Skin:    Comments: No foot lesions  Neurological:     Mental Status: She is alert.  Psychiatric:        Mood and Affect: Mood  normal.        Behavior: Behavior normal.            Assessment & Plan:

## 2023-05-03 ENCOUNTER — Encounter: Payer: Self-pay | Admitting: Internal Medicine

## 2023-05-16 IMAGING — CR DG CHEST 2V
2 series · 2 of 2 positions shown · non-contrast
Comparison: Remote exam 04/29/2006

CLINICAL DATA: COVID positive, rule out pneumonia. Cough and
chills.

EXAM:
CHEST - 2 VIEW

[chest pa]
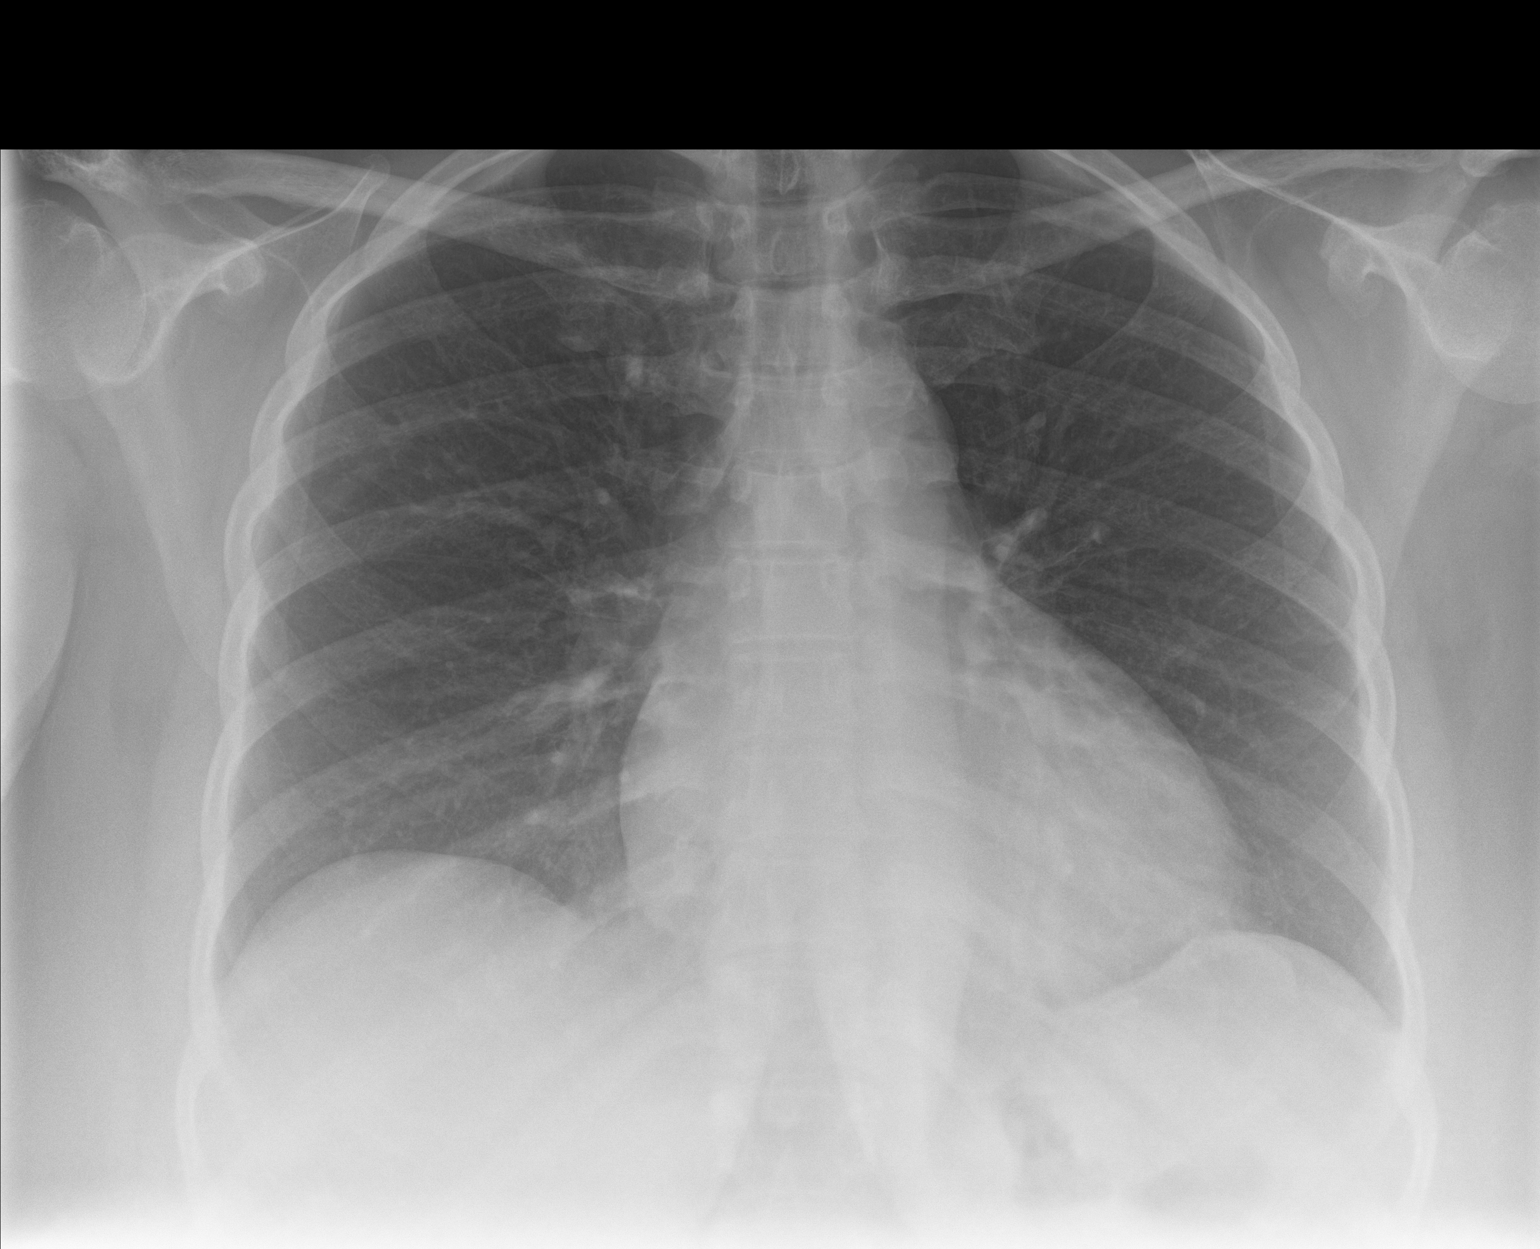

[chest lat]
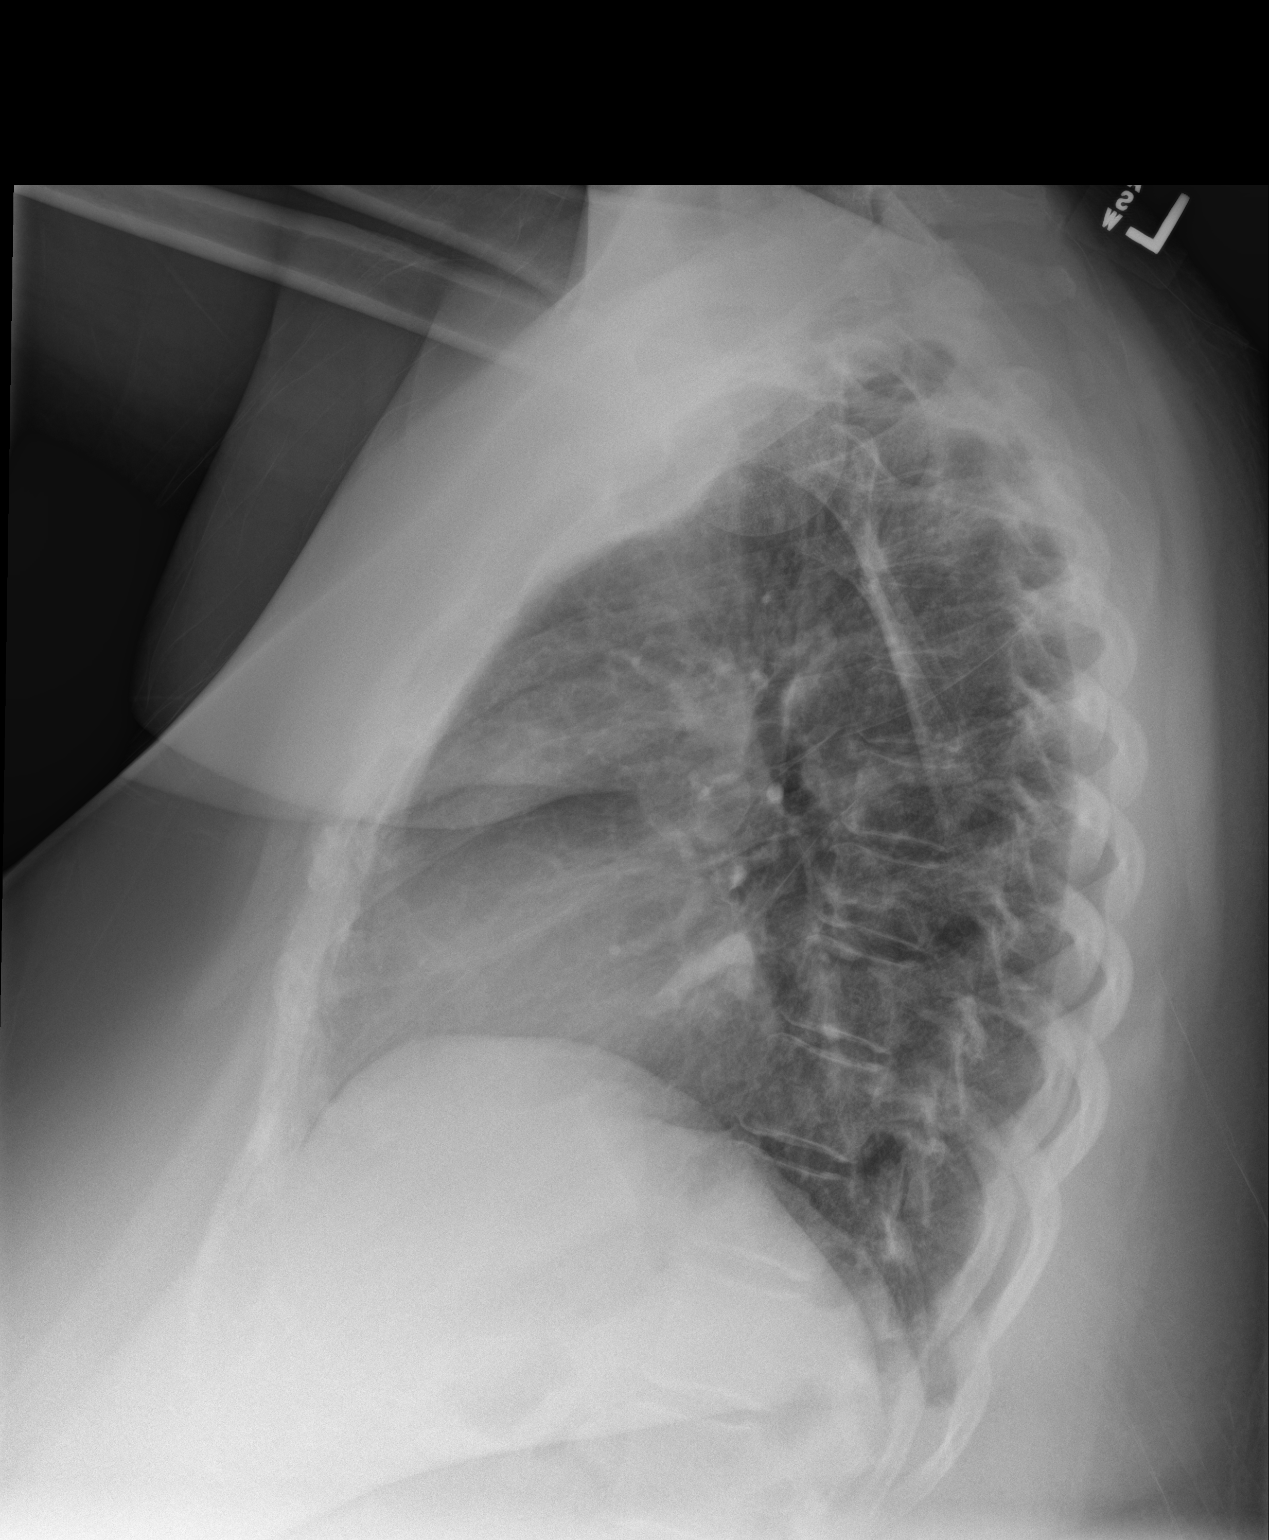

[2 of 2 positions shown; findings below may reference images not displayed]

FINDINGS: Minimal patchy opacity in the periphery of the right mid lung zone.
Left lung is clear. Normal heart size and mediastinal contours. No
pneumothorax, pleural effusion, or pulmonary edema. No acute osseous
abnormalities are seen.
IMPRESSION: Minimal patchy opacity in the periphery of the right mid lung zone,
likely pneumonia in the setting of [id number]-G2.

## 2023-06-10 DIAGNOSIS — H35033 Hypertensive retinopathy, bilateral: Secondary | ICD-10-CM | POA: Diagnosis not present

## 2023-08-29 ENCOUNTER — Other Ambulatory Visit: Payer: Self-pay | Admitting: Internal Medicine

## 2023-10-14 ENCOUNTER — Telehealth: Payer: Self-pay | Admitting: Internal Medicine

## 2023-10-14 ENCOUNTER — Ambulatory Visit: Payer: BC Managed Care – PPO | Admitting: Internal Medicine

## 2023-10-14 ENCOUNTER — Encounter: Payer: Self-pay | Admitting: Internal Medicine

## 2023-10-14 VITALS — BP 118/80 | HR 75 | Temp 98.4°F | Ht 65.25 in | Wt 290.5 lb

## 2023-10-14 DIAGNOSIS — E1159 Type 2 diabetes mellitus with other circulatory complications: Secondary | ICD-10-CM | POA: Diagnosis not present

## 2023-10-14 DIAGNOSIS — Z0000 Encounter for general adult medical examination without abnormal findings: Secondary | ICD-10-CM

## 2023-10-14 DIAGNOSIS — Z1211 Encounter for screening for malignant neoplasm of colon: Secondary | ICD-10-CM

## 2023-10-14 DIAGNOSIS — Z7984 Long term (current) use of oral hypoglycemic drugs: Secondary | ICD-10-CM

## 2023-10-14 DIAGNOSIS — I10 Essential (primary) hypertension: Secondary | ICD-10-CM | POA: Diagnosis not present

## 2023-10-14 LAB — LIPID PANEL
Cholesterol: 183 mg/dL (ref 0–200)
HDL: 49 mg/dL (ref >39.00)
LDL Cholesterol: 108 mg/dL — ABNORMAL HIGH (ref 0–99)
NonHDL: 134.47
Total CHOL/HDL Ratio: 4
Triglycerides: 131 mg/dL (ref 0.0–149.0)
VLDL: 26.2 mg/dL (ref 0.0–40.0)

## 2023-10-14 LAB — COMPREHENSIVE METABOLIC PANEL WITH GFR
ALT: 30 U/L (ref 0–35)
AST: 24 U/L (ref 0–37)
Albumin: 4.2 g/dL (ref 3.5–5.2)
Alkaline Phosphatase: 41 U/L (ref 39–117)
BUN: 13 mg/dL (ref 6–23)
CO2: 31 meq/L (ref 19–32)
Calcium: 9.5 mg/dL (ref 8.4–10.5)
Chloride: 103 meq/L (ref 96–112)
Creatinine, Ser: 0.78 mg/dL (ref 0.40–1.20)
GFR: 85.99 mL/min (ref >60.00)
Glucose, Bld: 126 mg/dL — ABNORMAL HIGH (ref 70–99)
Potassium: 3.8 meq/L (ref 3.5–5.1)
Sodium: 142 meq/L (ref 135–145)
Total Bilirubin: 0.6 mg/dL (ref 0.2–1.2)
Total Protein: 7.1 g/dL (ref 6.0–8.3)

## 2023-10-14 LAB — CBC
HCT: 38.7 % (ref 36.0–46.0)
Hemoglobin: 12.4 g/dL (ref 12.0–15.0)
MCHC: 32 g/dL (ref 30.0–36.0)
MCV: 80.8 fl (ref 78.0–100.0)
Platelets: 304 K/uL (ref 150.0–400.0)
RBC: 4.79 Mil/uL (ref 3.87–5.11)
RDW: 14.1 % (ref 11.5–15.5)
WBC: 8.9 K/uL (ref 4.0–10.5)

## 2023-10-14 LAB — HEMOGLOBIN A1C: Hgb A1c MFr Bld: 7.1 % — ABNORMAL HIGH (ref 4.6–6.5)

## 2023-10-14 LAB — MICROALBUMIN / CREATININE URINE RATIO
Creatinine,U: 183.2 mg/dL
Microalb Creat Ratio: 47.6 mg/g — ABNORMAL HIGH (ref 0.0–30.0)
Microalb, Ur: 8.7 mg/dL — ABNORMAL HIGH (ref 0.0–1.9)

## 2023-10-14 LAB — HM DIABETES FOOT EXAM

## 2023-10-14 NOTE — Assessment & Plan Note (Signed)
 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

## 2023-10-14 NOTE — Assessment & Plan Note (Signed)
 BP Readings from Last 3 Encounters:  10/14/23 118/80  04/16/23 110/62  10/14/22 110/70   Lisinopril /hydrochlorothiazide  20/25

## 2023-10-14 NOTE — Telephone Encounter (Signed)
 During checkout, pt mentioned she asked Dr. Letvak if Dr. Avelina would take her on as a new pt since he's retiring. Is this TOC okay? Per Dr. Letvak, pt needs a 6 month f/u. Call back # [phone]

## 2023-10-14 NOTE — Assessment & Plan Note (Signed)
 Seems to have good control Metformin  1000/500

## 2023-10-14 NOTE — Progress Notes (Signed)
 Subjective:    Patient ID: Lindsey Walls, female    DOB: 04-25-1970, 53 y.o.   MRN: [id number]  HPI Here for physical  Doing okay Feels great Working on fitness---to increase heart rate Thinks sugars are good  Still gets some AM sciatica on left Does well after she is up for a while  Sugars are really good--generally under 125 fasting---and lower later  Current Outpatient Medications on File Prior to Visit  Medication Sig Dispense Refill   albuterol  (PROAIR  HFA) 108 (90 Base) MCG/ACT inhaler Inhale 2 puffs into the lungs every 6 (six) hours as needed for wheezing or shortness of breath. 18 g 1   baclofen  (LIORESAL ) 10 MG tablet Take 1 tablet (10 mg total) by mouth 3 (three) times daily as needed for muscle spasms. 30 each 1   HYDROcodone  bit-homatropine (HYCODAN) 5-1.5 MG/5ML syrup Take 5 mLs by mouth at bedtime as needed for cough. 120 mL 0   lisinopril -hydrochlorothiazide  (ZESTORETIC ) 20-25 MG tablet TAKE 1 TABLET BY MOUTH EVERY DAY 90 tablet 3   metFORMIN  (GLUCOPHAGE -XR) 500 MG 24 hr tablet TAKE 2 TABLET BY MOUTH EVERY MORNING AND 1 EVERY EVENING 270 tablet 2   naproxen  (NAPROSYN ) 500 MG tablet TAKE 1 TABLET(500 MG) BY MOUTH TWICE DAILY AS NEEDED 60 tablet 1   rosuvastatin  (CRESTOR ) 10 MG tablet Take 1 tablet (10 mg total) by mouth 2 (two) times a week. 24 tablet 3   Turmeric 500 MG CAPS Take 1 capsule by mouth daily.     No current facility-administered medications on file prior to visit.    No Known Allergies  Past Medical History:  Diagnosis Date   Anemia    Bronchitis, chronic (HCC)    Chronic back pain    COVID-19 12/2020   Diabetes mellitus without complication (HCC)    Fibroid    Hyperlipidemia    Hypertension    Obesity    Obesity     Past Surgical History:  Procedure Laterality Date   ABDOMINAL HYSTERECTOMY N/A 11/23/2012   Procedure: HYSTERECTOMY ABDOMINAL with repair of laproscopic torcar incision; lysis of adhesions ;  Surgeon: Rosaline DELENA Luna, MD;  Location: [facility];  Service: Gynecology;  Laterality: N/A;   CESAREAN SECTION     CHOLECYSTECTOMY     CYSTOSCOPY N/A 11/23/2012   Procedure: CYSTOSCOPY;  Surgeon: Rosaline DELENA Luna, MD;  Location: [facility];  Service: Gynecology;  Laterality: N/A;   ENDOMETRIAL ABLATION  08/08   OMENTECTOMY N/A 11/23/2012   Procedure: OMENTECTOMY;  Surgeon: Rosaline DELENA Luna, MD;  Location: [facility];  Service: Gynecology;  Laterality: N/A;   ROBOTIC ASSISTED TOTAL HYSTERECTOMY N/A 11/23/2012   Procedure: ATTEMPTED ROBOTIC ASSISTED TOTAL HYSTERECTOMY;  Surgeon: Rosaline DELENA Luna, MD;  Location: [facility];  Service: Gynecology;  Laterality: N/A;   TIBIA FRACTURE SURGERY      Family History  Problem Relation Age of Onset   Diabetes Mother    Coronary artery disease Mother    Hypertension Father    Cancer Father        lung   COPD Father    Diabetes Sister    Coronary artery disease Sister    Diabetes Maternal Aunt     Social History   Socioeconomic History   Marital status: Single    Spouse name: Not on file   Number of children: 2   Years of education: Not on file   Highest education level: Associate degree: occupational, Scientist, product/process development, or vocational program  Occupational History  Occupation: Auditor--remote work    Comment: Herbalist  Tobacco Use   Smoking status: Never    Passive exposure: Past   Smokeless tobacco: Never  Vaping Use   Vaping status: Never Used  Substance and Sexual Activity   Alcohol use: No    Alcohol/week: 0.0 standard drinks of alcohol   Drug use: No   Sexual activity: Not on file  Other Topics Concern   Not on file  Social History Narrative   Not on file   Social Drivers of Health   Financial Resource Strain: Low Risk  (10/13/2023)   Overall Financial Resource Strain (CARDIA)    Difficulty of Paying Living Expenses: Not very hard  Food Insecurity: No Food Insecurity (10/13/2023)   Hunger Vital Sign    Worried About Running Out of Food in the Last  Year: Never true    Ran Out of Food in the Last Year: Never true  Transportation Needs: No Transportation Needs (10/13/2023)   PRAPARE - Administrator, Civil Service (Medical): No    Lack of Transportation (Non-Medical): No  Physical Activity: Insufficiently Active (10/13/2023)   Exercise Vital Sign    Days of Exercise per Week: 5 days    Minutes of Exercise per Session: 20 min  Stress: No Stress Concern Present (10/13/2023)   [employer] of Occupational Health - Occupational Stress Questionnaire    Feeling of Stress: Not at all  Social Connections: Moderately Integrated (10/13/2023)   Social Connection and Isolation Panel    Frequency of Communication with Friends and Family: More than three times a week    Frequency of Social Gatherings with Friends and Family: Twice a week    Attends Religious Services: More than 4 times per year    Active Member of [employer] or Organizations: Yes    Attends Engineer, structural: More than 4 times per year    Marital Status: Divorced  Catering manager Violence: Not on file   Review of Systems  Constitutional:  Negative for fatigue.       Weight down slightly wears seat belt  HENT:  Negative for dental problem, hearing loss and tinnitus.        Keeps up with dentist  Eyes:  Negative for visual disturbance.       No diplopia or unilateral vision loss  Respiratory:  Negative for cough, chest tightness and shortness of breath.   Cardiovascular:  Negative for chest pain, palpitations and leg swelling.  Gastrointestinal:  Negative for blood in stool and constipation.       No heartburn  Endocrine: Negative for polydipsia and polyuria.  Genitourinary:  Negative for dysuria and hematuria.       No sexual concerns  Musculoskeletal:  Positive for back pain. Negative for joint swelling.       Uses aleve  before some housework--keeps her back from hurting  Skin:  Negative for rash.  Allergic/Immunologic: Positive for environmental  allergies. Negative for immunocompromised state.       Mild symptoms only  Neurological:  Negative for dizziness, syncope, light-headedness and headaches.       Occ off on balance--brief Slight tingling left heel when she flexes forward  Hematological:  Negative for adenopathy. Does not bruise/bleed easily.  Psychiatric/Behavioral:  Negative for dysphoric mood and sleep disturbance. The patient is not nervous/anxious.        Objective:   Physical Exam Constitutional:      Appearance: Normal appearance.  HENT:  Mouth/Throat:     Pharynx: No oropharyngeal exudate or posterior oropharyngeal erythema.  Eyes:     Conjunctiva/sclera: Conjunctivae normal.     Pupils: Pupils are equal, round, and reactive to light.  Cardiovascular:     Rate and Rhythm: Normal rate and regular rhythm.     Pulses: Normal pulses.     Heart sounds: No murmur heard.    No gallop.  Pulmonary:     Effort: Pulmonary effort is normal.     Breath sounds: Normal breath sounds. No wheezing or rales.  Abdominal:     Palpations: Abdomen is soft.     Tenderness: There is no abdominal tenderness.  Musculoskeletal:     Cervical back: Neck supple.     Right lower leg: No edema.     Left lower leg: No edema.  Lymphadenopathy:     Cervical: No cervical adenopathy.  Skin:    Findings: No rash.     Comments: No foot lesions  Neurological:     General: No focal deficit present.     Mental Status: She is alert and oriented to person, place, and time.     Comments: Normal sensation in feet  Psychiatric:        Mood and Affect: Mood normal.        Behavior: Behavior normal.            Assessment & Plan:

## 2023-10-15 ENCOUNTER — Ambulatory Visit: Payer: Self-pay | Admitting: Internal Medicine

## 2023-10-17 ENCOUNTER — Encounter: Payer: Self-pay | Admitting: Internal Medicine

## 2023-10-17 NOTE — Telephone Encounter (Signed)
 Form printed and filled out as much as possible. Placed in Dr Marval inbox on his desk. Pt will need to sign the form before it can be faxed or she can pick it up and take care of it herself. Her wait circumference will need to be done to complete our part.

## 2023-10-20 NOTE — Telephone Encounter (Signed)
 Sch toc for 04/22/24

## 2023-10-21 NOTE — Telephone Encounter (Signed)
Patient picked up forms from front desk.

## 2023-10-22 ENCOUNTER — Encounter: Payer: Self-pay | Admitting: Gastroenterology

## 2023-11-02 ENCOUNTER — Other Ambulatory Visit: Payer: Self-pay | Admitting: Internal Medicine

## 2023-11-12 ENCOUNTER — Ambulatory Visit (AMBULATORY_SURGERY_CENTER): Admitting: *Deleted

## 2023-11-12 ENCOUNTER — Encounter: Payer: Self-pay | Admitting: Gastroenterology

## 2023-11-12 VITALS — Ht 65.0 in | Wt 290.0 lb

## 2023-11-12 DIAGNOSIS — Z1211 Encounter for screening for malignant neoplasm of colon: Secondary | ICD-10-CM

## 2023-11-12 MED ORDER — NA SULFATE-K SULFATE-MG SULF 17.5-3.13-1.6 GM/177ML PO SOLN
1.0000 | Freq: Once | ORAL | 0 refills | Status: AC
Start: 2023-11-12 — End: 2023-11-12

## 2023-11-12 NOTE — Progress Notes (Signed)
 Pre visit completed over telephone.  Instructions forwarded through email and Mychart.    No egg or soy allergy known to patient  No issues known to pt with past sedation with any surgeries or procedures Patient denies ever being told they had issues or difficulty with intubation  No FH of Malignant Hyperthermia Pt is not on diet pills Pt is not on  home 02  Pt is not on blood thinners  Pt denies issues with constipation  No A fib or A flutter Have any cardiac testing pending-- NO Pt instructed to use Singlecare.com or GoodRx for a price reduction on prep    Chart reviewed by DOROTHA Schillings, CRNA

## 2023-11-26 ENCOUNTER — Encounter: Payer: Self-pay | Admitting: Gastroenterology

## 2023-11-26 ENCOUNTER — Ambulatory Visit: Admitting: Gastroenterology

## 2023-11-26 VITALS — BP 108/60 | HR 70 | Temp 97.3°F | Resp 23 | Ht 65.0 in | Wt 290.0 lb

## 2023-11-26 DIAGNOSIS — K5730 Diverticulosis of large intestine without perforation or abscess without bleeding: Secondary | ICD-10-CM

## 2023-11-26 DIAGNOSIS — K648 Other hemorrhoids: Secondary | ICD-10-CM | POA: Diagnosis not present

## 2023-11-26 DIAGNOSIS — D128 Benign neoplasm of rectum: Secondary | ICD-10-CM

## 2023-11-26 DIAGNOSIS — Z1211 Encounter for screening for malignant neoplasm of colon: Secondary | ICD-10-CM

## 2023-11-26 DIAGNOSIS — K621 Rectal polyp: Secondary | ICD-10-CM

## 2023-11-26 MED ORDER — SODIUM CHLORIDE 0.9 % IV SOLN
500.0000 mL | Freq: Once | INTRAVENOUS | Status: AC
Start: 2023-11-26 — End: ?

## 2023-11-26 NOTE — Progress Notes (Signed)
 Called to room to assist during endoscopic procedure.  Patient ID and intended procedure confirmed with present staff. Received instructions for my participation in the procedure from the performing physician.

## 2023-11-26 NOTE — Op Note (Signed)
 [facility] Patient Name: Lindsey Walls Procedure Date: 11/26/2023 11:28 AM MRN: [id number] Endoscopist: Victory L. Legrand , MD, [id number] Age: 53 Referring MD:  Date of Birth: [birth year] Gender: Female Account #: [id number] Procedure:                Colonoscopy Indications:              Screening for colorectal malignant neoplasm, This                            is the patient's first colonoscopy Medicines:                Monitored Anesthesia Care Procedure:                Pre-Anesthesia Assessment:                           - Prior to the procedure, a History and Physical                            was performed, and patient medications and                            allergies were reviewed. The patient's tolerance of                            previous anesthesia was also reviewed. The risks                            and benefits of the procedure and the sedation                            options and risks were discussed with the patient.                            All questions were answered, and informed consent                            was obtained. Prior Anticoagulants: The patient has                            taken no anticoagulant or antiplatelet agents. ASA                            Grade Assessment: III - A patient with severe                            systemic disease. After reviewing the risks and                            benefits, the patient was deemed in satisfactory                            condition to undergo the procedure.  After obtaining informed consent, the colonoscope                            was passed under direct vision. Throughout the                            procedure, the patient's blood pressure, pulse, and                            oxygen saturations were monitored continuously. The                            Olympus CF-HQ190L ([id number]) Colonoscope was                            introduced through the  anus and advanced to the the                            cecum, identified by appendiceal orifice and                            ileocecal valve. The colonoscopy was extremely                            difficult due to a redundant colon, significant                            looping and the patient's body habitus. Successful                            completion of the procedure was aided by using                            manual pressure and straightening and shortening                            the scope to obtain bowel loop reduction. The                            patient tolerated the procedure well. The quality                            of the bowel preparation was good. The ileocecal                            valve, appendiceal orifice, and rectum were                            photographed. The bowel preparation used was SUPREP. Scope In: 11:38:40 AM Scope Out: 11:59:49 AM Scope Withdrawal Time: 0 hours 11 minutes 39 seconds  Total Procedure Duration: 0 hours 21 minutes 9 seconds  Findings:                 The perianal and digital rectal examinations  were                            normal.                           Repeat examination of right colon under NBI                            performed.                           A few small-mouthed diverticula were found in the                            right colon.                           A diminutive polyp was found in the distal rectum.                            The polyp was semi-sessile. The polyp was removed                            with a cold biopsy forceps. Resection and retrieval                            were complete.                           Internal hemorrhoids were found. The hemorrhoids                            were small.                           The colon (entire examined portion) was                            significantly redundant.                           The exam was otherwise without  abnormality on                            direct and retroflexion views. Complications:            No immediate complications. Estimated Blood Loss:     Estimated blood loss was minimal. Impression:               - Diverticulosis in the right colon.                           - One diminutive polyp in the distal rectum,                            removed with a cold biopsy forceps. Resected and  retrieved.                           - Internal hemorrhoids.                           - Redundant colon.                           - The examination was otherwise normal on direct                            and retroflexion views. Recommendation:           - Patient has a contact number available for                            emergencies. The signs and symptoms of potential                            delayed complications were discussed with the                            patient. Return to normal activities tomorrow.                            Written discharge instructions were provided to the                            patient.                           - Resume previous diet.                           - Continue present medications.                           - Await pathology results.                           - Repeat colonoscopy is recommended for                            surveillance. The colonoscopy date will be                            determined after pathology results from today's                            exam become available for review. Lindsey Walls L. Legrand, MD 11/26/2023 12:04:08 PM This report has been signed electronically.

## 2023-11-26 NOTE — Progress Notes (Signed)
 Sedate, gd SR, tolerated procedure well, VSS, report to RN

## 2023-11-26 NOTE — Addendum Note (Signed)
 Addended by: CHESLEY BOUCHARD T on: 11/26/2023 04:34 PM   Modules accepted: Orders

## 2023-11-26 NOTE — Patient Instructions (Signed)
-  Handout on polyps, hemorrhoids and diverticulosis provided -Await pathology results  YOU HAD AN ENDOSCOPIC PROCEDURE TODAY AT THE [facility]:   Refer to the procedure report that was given to you for any specific questions about what was found during the examination.  If the procedure report does not answer your questions, please call your gastroenterologist to clarify.  If you requested that your care partner not be given the details of your procedure findings, then the procedure report has been included in a sealed envelope for you to review at your convenience later.  YOU SHOULD EXPECT: Some feelings of bloating in the abdomen. Passage of more gas than usual.  Walking can help get rid of the air that was put into your GI tract during the procedure and reduce the bloating. If you had a lower endoscopy (such as a colonoscopy or flexible sigmoidoscopy) you may notice spotting of blood in your stool or on the toilet paper. If you underwent a bowel prep for your procedure, you may not have a normal bowel movement for a few days.  Please Note:  You might notice some irritation and congestion in your nose or some drainage.  This is from the oxygen used during your procedure.  There is no need for concern and it should clear up in a day or so.  SYMPTOMS TO REPORT IMMEDIATELY:  Following lower endoscopy (colonoscopy or flexible sigmoidoscopy):  Excessive amounts of blood in the stool  Significant tenderness or worsening of abdominal pains  Swelling of the abdomen that is new, acute  Fever of 100F or higher  For urgent or emergent issues, a gastroenterologist can be reached at any hour by calling [phone]. Do not use MyChart messaging for urgent concerns.    DIET:  We do recommend a small meal at first, but then you may proceed to your regular diet.  Drink plenty of fluids but you should avoid alcoholic beverages for 24 hours.  ACTIVITY:  You should plan to take it easy for the  rest of today and you should NOT DRIVE or use heavy machinery until tomorrow (because of the sedation medicines used during the test).    FOLLOW UP: Our staff will call the number listed on your records the next business day following your procedure.  We will call around 7:15- 8:00 am to check on you and address any questions or concerns that you may have regarding the information given to you following your procedure. If we do not reach you, we will leave a message.     If any biopsies were taken you will be contacted by phone or by letter within the next 1-3 weeks.  Please call us  at [phone] if you have not heard about the biopsies in 3 weeks.    SIGNATURES/CONFIDENTIALITY: You and/or your care partner have signed paperwork which will be entered into your electronic medical record.  These signatures attest to the fact that that the information above on your After Visit Summary has been reviewed and is understood.  Full responsibility of the confidentiality of this discharge information lies with you and/or your care-partner.

## 2023-11-26 NOTE — Progress Notes (Signed)
 History and Physical:  This patient presents for endoscopic testing for: Encounter Diagnosis  Name Primary?   Special screening for malignant neoplasms, colon Yes    Average risk for colorectal cancer.  1st screening exam.  Patient denies chronic abdominal pain, rectal bleeding, constipation or diarrhea.    Patient is otherwise without complaints or active issues today.   Past Medical History: Past Medical History:  Diagnosis Date   Anemia    Bronchitis, chronic (HCC)    Chronic back pain    COVID-19 12/2020   Diabetes mellitus without complication (HCC)    Fibroid    Hyperlipidemia    Hypertension    Obesity    Obesity      Past Surgical History: Past Surgical History:  Procedure Laterality Date   ABDOMINAL HYSTERECTOMY N/A 11/23/2012   Procedure: HYSTERECTOMY ABDOMINAL with repair of laproscopic torcar incision; lysis of adhesions ;  Surgeon: Rosaline DELENA Luna, MD;  Location: [facility];  Service: Gynecology;  Laterality: N/A;   CESAREAN SECTION     CHOLECYSTECTOMY     CYSTOSCOPY N/A 11/23/2012   Procedure: CYSTOSCOPY;  Surgeon: Rosaline DELENA Luna, MD;  Location: [facility];  Service: Gynecology;  Laterality: N/A;   ENDOMETRIAL ABLATION  08/08   OMENTECTOMY N/A 11/23/2012   Procedure: OMENTECTOMY;  Surgeon: Rosaline DELENA Luna, MD;  Location: [facility];  Service: Gynecology;  Laterality: N/A;   ROBOTIC ASSISTED TOTAL HYSTERECTOMY N/A 11/23/2012   Procedure: ATTEMPTED ROBOTIC ASSISTED TOTAL HYSTERECTOMY;  Surgeon: Rosaline DELENA Luna, MD;  Location: [facility];  Service: Gynecology;  Laterality: N/A;   TIBIA FRACTURE SURGERY      Allergies: No Known Allergies  Outpatient Meds: Current Outpatient Medications  Medication Sig Dispense Refill   baclofen  (LIORESAL ) 10 MG tablet Take 1 tablet (10 mg total) by mouth 3 (three) times daily as needed for muscle spasms. 30 each 1   lisinopril -hydrochlorothiazide  (ZESTORETIC ) 20-25 MG tablet TAKE 1 TABLET BY MOUTH EVERY DAY 90 tablet 3    metFORMIN  (GLUCOPHAGE -XR) 500 MG 24 hr tablet TAKE 2 TABLET BY MOUTH EVERY MORNING AND 1 EVERY EVENING 270 tablet 1   naproxen  (NAPROSYN ) 500 MG tablet TAKE 1 TABLET(500 MG) BY MOUTH TWICE DAILY AS NEEDED 60 tablet 1   rosuvastatin  (CRESTOR ) 10 MG tablet Take 1 tablet (10 mg total) by mouth 2 (two) times a week. 24 tablet 3   Turmeric 500 MG CAPS Take 1 capsule by mouth daily.     albuterol  (PROAIR  HFA) 108 (90 Base) MCG/ACT inhaler Inhale 2 puffs into the lungs every 6 (six) hours as needed for wheezing or shortness of breath. 18 g 1   Glucosamine HCl-MSM (GLUCOSAMINE-MSM PO) Take 1,500 mg by mouth daily.     HYDROcodone  bit-homatropine (HYCODAN) 5-1.5 MG/5ML syrup Take 5 mLs by mouth at bedtime as needed for cough. 120 mL 0   Current Facility-Administered Medications  Medication Dose Route Frequency Provider Last Rate Last Admin   0.9 %  sodium chloride  infusion  500 mL Intravenous Once Danis, Victory CROME III, MD          ___________________________________________________________________ Objective   Exam:  BP 105/69   Pulse 62   Temp (!) 97.3 F (36.3 C)   Ht 5' 5 (1.651 m)   Wt 290 lb (131.5 kg)   LMP 10/12/2012   SpO2 99%   BMI 48.26 kg/m   CV: regular , S1/S2 Resp: clear to auscultation bilaterally, normal RR and effort noted GI: soft, no tenderness, with active bowel sounds.  Assessment: Encounter Diagnosis  Name Primary?   Special screening for malignant neoplasms, colon Yes     Plan: Colonoscopy   The benefits and risks of the planned procedure(s) were described in detail with the patient or (when appropriate) their health care proxy.  Risks were outlined as including, but not limited to, bleeding, infection, perforation, adverse medication reaction leading to cardiac or pulmonary decompensation, pancreatitis (if ERCP).  The limitation of incomplete mucosal visualization was also discussed.  No guarantees or warranties were given.  The patient is appropriate  for an endoscopic procedure in the ambulatory setting.   - Victory Brand, MD

## 2023-11-26 NOTE — Progress Notes (Signed)
 Pt's states no medical or surgical changes since previsit or office visit.

## 2023-11-27 ENCOUNTER — Telehealth: Payer: Self-pay

## 2023-11-27 NOTE — Telephone Encounter (Signed)
  Follow up Call-     11/26/2023   10:03 AM  Call back number  Post procedure Call Back phone  # [phone]  Permission to leave phone message Yes     Patient questions:  Do you have a fever, pain , or abdominal swelling? No. Pain Score  0 *  Have you tolerated food without any problems? Yes.    Have you been able to return to your normal activities? Yes.    Do you have any questions about your discharge instructions: Diet   No. Medications  No. Follow up visit  No.  Do you have questions or concerns about your Care? No.  Actions: * If pain score is 4 or above: No action needed, pain <4.

## 2023-12-01 LAB — SURGICAL PATHOLOGY

## 2023-12-02 ENCOUNTER — Ambulatory Visit: Payer: Self-pay | Admitting: Gastroenterology

## 2023-12-09 ENCOUNTER — Other Ambulatory Visit: Payer: Self-pay | Admitting: Internal Medicine

## 2023-12-17 ENCOUNTER — Other Ambulatory Visit: Payer: Self-pay | Admitting: Internal Medicine

## 2023-12-17 MED ORDER — ROSUVASTATIN CALCIUM 10 MG PO TABS: 10.0000 mg | ORAL_TABLET | ORAL | 3 refills | Status: AC

## 2023-12-17 NOTE — Telephone Encounter (Signed)
 Copied from CRM #[id number]. Topic: Clinical - Medication Question >> Dec 17, 2023 11:04 AM Aisha D wrote: Reason for CRM: Pt just submitted a medication refill for the rosuvastatin  (CRESTOR ) 10 MG tablet and is requesting that it gets approved today due to her leaving out of town tomorrow. Pt stated that she requested the refill a week ago with another medication but it didn't go through. Pt would like a callback with an update.    Patient does not have TOC until January.  Lipid Panel     Component Value Date/Time   CHOL 183 10/14/2023 0827   TRIG 131.0 10/14/2023 0827   HDL 49.00 10/14/2023 0827   CHOLHDL 4 10/14/2023 0827   VLDL 26.2 10/14/2023 0827   LDLCALC 108 (H) 10/14/2023 0827   LDLDIRECT 169.8 02/09/2013 0920

## 2023-12-17 NOTE — Telephone Encounter (Unsigned)
 Copied from CRM #[id number]. Topic: Clinical - Medication Refill >> Dec 17, 2023 11:01 AM Aisha D wrote: Medication: rosuvastatin  (CRESTOR ) 10 MG tablet  Has the patient contacted their pharmacy? Yes (Agent: If no, request that the patient contact the pharmacy for the refill. If patient does not wish to contact the pharmacy document the reason why and proceed with request.) (Agent: If yes, when and what did the pharmacy advise?)  This is the patient's preferred pharmacy:  [id number] [place], [place] - [address] AT [facility] OF [address] [place] [address] [place] [address] Phone: [phone] Fax: [phone]  Is this the correct pharmacy for this prescription? Yes If no, delete pharmacy and type the correct one.   Has the prescription been filled recently? No  Is the patient out of the medication? Yes  Has the patient been seen for an appointment in the last year OR does the patient have an upcoming appointment? Yes  Can we respond through MyChart? Yes  Agent: Please be advised that Rx refills may take up to 3 business days. We ask that you follow-up with your pharmacy.

## 2023-12-17 NOTE — Telephone Encounter (Signed)
 FYI Only or Action Required?: Action required by provider: medication refill request.  Patient was last seen in primary care on 10/14/2023 by Jimmy Charlie FERNS, MD.  Called Nurse Triage reporting No chief complaint on file..  Symptoms began today.  Interventions attempted: Nothing.  Symptoms are: stable.  Triage Disposition: No disposition on file.  Patient/caregiver understands and will follow disposition?:

## 2023-12-30 ENCOUNTER — Other Ambulatory Visit: Payer: Self-pay

## 2023-12-30 MED ORDER — LISINOPRIL-HYDROCHLOROTHIAZIDE 20-25 MG PO TABS: ORAL_TABLET | ORAL | 1 refills | Status: AC

## 2023-12-30 NOTE — Telephone Encounter (Signed)
 Rx sent electronically.

## 2024-02-05 ENCOUNTER — Encounter: Payer: Self-pay | Admitting: Family Medicine

## 2024-02-05 MED ORDER — METFORMIN HCL ER 500 MG PO TB24: ORAL_TABLET | ORAL | 0 refills | Status: DC

## 2024-02-10 ENCOUNTER — Encounter: Payer: Self-pay | Admitting: Family Medicine

## 2024-02-11 MED ORDER — PROMETHAZINE-DM 6.25-15 MG/5ML PO SYRP: 5.0000 mL | ORAL_SOLUTION | Freq: Every evening | ORAL | 0 refills | Status: DC | PRN

## 2024-04-22 ENCOUNTER — Encounter: Admitting: Family Medicine

## 2024-04-23 ENCOUNTER — Ambulatory Visit: Admitting: Family Medicine

## 2024-04-23 ENCOUNTER — Ambulatory Visit: Payer: Self-pay | Admitting: Family Medicine

## 2024-04-23 ENCOUNTER — Encounter: Payer: Self-pay | Admitting: Family Medicine

## 2024-04-23 VITALS — BP 108/70 | HR 91 | Temp 97.5°F | Ht 65.25 in | Wt 287.0 lb

## 2024-04-23 DIAGNOSIS — J4520 Mild intermittent asthma, uncomplicated: Secondary | ICD-10-CM

## 2024-04-23 DIAGNOSIS — I10 Essential (primary) hypertension: Secondary | ICD-10-CM

## 2024-04-23 DIAGNOSIS — E1159 Type 2 diabetes mellitus with other circulatory complications: Secondary | ICD-10-CM

## 2024-04-23 DIAGNOSIS — Z7984 Long term (current) use of oral hypoglycemic drugs: Secondary | ICD-10-CM | POA: Diagnosis not present

## 2024-04-23 DIAGNOSIS — E785 Hyperlipidemia, unspecified: Secondary | ICD-10-CM

## 2024-04-23 DIAGNOSIS — N951 Menopausal and female climacteric states: Secondary | ICD-10-CM

## 2024-04-23 LAB — POCT GLYCOSYLATED HEMOGLOBIN (HGB A1C): Hemoglobin A1C: 6.8 % — AB (ref 4.0–5.6)

## 2024-04-23 LAB — MICROALBUMIN / CREATININE URINE RATIO
Creatinine,U: 117.7 mg/dL
Microalb Creat Ratio: 6.3 mg/g (ref 0.0–30.0)
Microalb, Ur: 0.7 mg/dL (ref 0.7–1.9)

## 2024-04-23 MED ORDER — NAPROXEN 500 MG PO TABS: 500.0000 mg | ORAL_TABLET | Freq: Two times a day (BID) | ORAL | 1 refills | Status: AC | PRN

## 2024-04-23 NOTE — Assessment & Plan Note (Signed)
 Inadequate control, chronic.   Only able to tolerate rosuvastatin  2 times a week.   Rosuvastatin  10 mg twice weekly

## 2024-04-23 NOTE — Assessment & Plan Note (Signed)
 Chronic, BMI 47  Wt Readings from Last 3 Encounters:  04/23/24 287 lb (130.2 kg)  11/26/23 290 lb (131.5 kg)  11/12/23 290 lb (131.5 kg)

## 2024-04-23 NOTE — Assessment & Plan Note (Signed)
 Uses baclofen  10 mg 3 times a day as needed for muscle spasms Using naproxen  1 tablet twice daily as needed.  Needs refill

## 2024-04-23 NOTE — Assessment & Plan Note (Signed)
 Stable, chronic.  Continue current medication.  Metformin  XR 500 mg 2 tablets in the morning and 1 tablet at night.   On statin Foot exam up-to-date Eye exam up-to-date Microalbumin up-to-date but was slightly abnormal last time.. wishes to recheck today.

## 2024-04-23 NOTE — Assessment & Plan Note (Signed)
 Stable, chronic.  Continue current medication.  On lisinopril /hydrochlorothiazide  20/25 mg daily

## 2024-04-23 NOTE — Assessment & Plan Note (Signed)
 just an issue of she gets sick or change of season.  Rare use of albuterol  1-2 times per year.

## 2024-04-23 NOTE — Assessment & Plan Note (Signed)
 Chronic, she will try coconut oil for vaginal dryness.  If not improving we can consider vaginal estrogen for vaginal dryness.

## 2024-04-23 NOTE — Progress Notes (Signed)
 "   Patient ID: Lindsey Walls, female    DOB: [birth year], 55 y.o.   MRN: [id number]  This visit was conducted in person.  BP 108/70   Pulse 91   Temp (!) 97.5 F (36.4 C) (Temporal)   Ht 5' 5.25 (1.657 m)   Wt 287 lb (130.2 kg)   LMP 10/12/2012   SpO2 97%   BMI 47.39 kg/m    CC:  Chief Complaint  Patient presents with   Establish Care    TOC from Dr. Jimmy    Subjective:   HPI: Lindsey Walls is a 54 y.o. female presenting on 04/23/2024 for Establish Care (TOC from Dr. Jimmy)  Previous PCP Jimmy  Last CPX: 10/14/2023 .SABRA  Reviewed in detail    Diabetes:  Lab Results  Component Value Date   HGBA1C 6.8 (A) 04/23/2024  Using medications without difficulties: Hypoglycemic episodes: Hyperglycemic episodes: none Feet problems: no ulcers Blood Sugars averaging: FB S 120s eye exam within last year: done  Hypertension:    ON lisinopril / hydrochlorothiazide  20/25 mg daily BP Readings from Last 3 Encounters:  04/23/24 108/70  11/26/23 108/60  10/14/23 118/80  Using medication without problems or lightheadedness:  none Chest pain with exertion: none Edema: none Short of breath: none Average home BPs: 122/72 Other issues:  Elevated Cholesterol: LDL almost at goal last check on rosuvastatin ..  Only tolerates 10 mg 2-3 times a week... as caused headaches. Lab Results  Component Value Date   CHOL 183 10/14/2023   HDL 49.00 10/14/2023   LDLCALC 108 (H) 10/14/2023   LDLDIRECT 169.8 02/09/2013   TRIG 131.0 10/14/2023   CHOLHDL 4 10/14/2023  Using medications without problems: Muscle aches:  Diet compliance: moderate given holidays Exercise: Yoga 2 times a week and walking 15-20 min daily Other complaints:  Mom  and Dad with DM MGM TIA Sister: CAD in 50 years   Mild intermittent asthma.. just an issue of she gets sick or change of season.  Rare use of albuterol  1-2 times per year.  Has lost from 315 with metformin . Wt Readings from Last 3 Encounters:   04/23/24 287 lb (130.2 kg)  11/26/23 290 lb (131.5 kg)  11/12/23 290 lb (131.5 kg)    Relevant past medical, surgical, family and social history reviewed and updated as indicated. Interim medical history since our last visit reviewed. Allergies and medications reviewed and updated. Outpatient Medications Prior to Visit  Medication Sig Dispense Refill   albuterol  (PROAIR  HFA) 108 (90 Base) MCG/ACT inhaler Inhale 2 puffs into the lungs every 6 (six) hours as needed for wheezing or shortness of breath. 18 g 1   baclofen  (LIORESAL ) 10 MG tablet Take 1 tablet (10 mg total) by mouth 3 (three) times daily as needed for muscle spasms. 30 each 1   HYDROcodone  bit-homatropine (HYCODAN) 5-1.5 MG/5ML syrup Take 5 mLs by mouth at bedtime as needed for cough. 120 mL 0   lisinopril -hydrochlorothiazide  (ZESTORETIC ) 20-25 MG tablet TAKE 1 TABLET BY MOUTH EVERY DAY 90 tablet 1   metFORMIN  (GLUCOPHAGE -XR) 500 MG 24 hr tablet TAKE 2 TABLET BY MOUTH EVERY MORNING AND 1 EVERY EVENING 270 tablet 0   rosuvastatin  (CRESTOR ) 10 MG tablet Take 1 tablet (10 mg total) by mouth 2 (two) times a week. 24 tablet 3   Turmeric 500 MG CAPS Take 1 capsule by mouth daily.     naproxen  (NAPROSYN ) 500 MG tablet TAKE 1 TABLET(500 MG) BY MOUTH TWICE DAILY AS NEEDED 60 tablet 1  Glucosamine HCl-MSM (GLUCOSAMINE-MSM PO) Take 1,500 mg by mouth daily.     promethazine -dextromethorphan (PROMETHAZINE -DM) 6.25-15 MG/5ML syrup Take 5 mLs by mouth at bedtime as needed for cough. 118 mL 0   Facility-Administered Medications Prior to Visit  Medication Dose Route Frequency Provider Last Rate Last Admin   0.9 %  sodium chloride  infusion  500 mL Intravenous Once Danis, Henry L III, MD         Per HPI unless specifically indicated in ROS section below Review of Systems  Constitutional:  Negative for fatigue and fever.  HENT:  Negative for congestion.   Eyes:  Negative for pain.  Respiratory:  Negative for cough and shortness of breath.    Cardiovascular:  Negative for chest pain, palpitations and leg swelling.  Gastrointestinal:  Negative for abdominal pain.  Genitourinary:  Negative for dysuria and vaginal bleeding.  Musculoskeletal:  Negative for back pain.  Neurological:  Negative for syncope, light-headedness and headaches.  Psychiatric/Behavioral:  Negative for dysphoric mood.    Objective:  BP 108/70   Pulse 91   Temp (!) 97.5 F (36.4 C) (Temporal)   Ht 5' 5.25 (1.657 m)   Wt 287 lb (130.2 kg)   LMP 10/12/2012   SpO2 97%   BMI 47.39 kg/m   Wt Readings from Last 3 Encounters:  04/23/24 287 lb (130.2 kg)  11/26/23 290 lb (131.5 kg)  11/12/23 290 lb (131.5 kg)      Physical Exam Constitutional:      General: She is not in acute distress.    Appearance: Normal appearance. She is well-developed. She is obese. She is not ill-appearing or toxic-appearing.  HENT:     Head: Normocephalic.     Right Ear: Hearing, tympanic membrane, ear canal and external ear normal. Tympanic membrane is not erythematous, retracted or bulging.     Left Ear: Hearing, tympanic membrane, ear canal and external ear normal. Tympanic membrane is not erythematous, retracted or bulging.     Nose: No mucosal edema or rhinorrhea.     Right Sinus: No maxillary sinus tenderness or frontal sinus tenderness.     Left Sinus: No maxillary sinus tenderness or frontal sinus tenderness.     Mouth/Throat:     Pharynx: Uvula midline.  Eyes:     General: Lids are normal. Lids are everted, no foreign bodies appreciated.     Conjunctiva/sclera: Conjunctivae normal.     Pupils: Pupils are equal, round, and reactive to light.  Neck:     Thyroid: No thyroid mass or thyromegaly.     Vascular: No carotid bruit.     Trachea: Trachea normal.  Cardiovascular:     Rate and Rhythm: Normal rate and regular rhythm.     Pulses: Normal pulses.     Heart sounds: Normal heart sounds, S1 normal and S2 normal. No murmur heard.    No friction rub. No gallop.   Pulmonary:     Effort: Pulmonary effort is normal. No tachypnea or respiratory distress.     Breath sounds: Normal breath sounds. No decreased breath sounds, wheezing, rhonchi or rales.  Abdominal:     General: Bowel sounds are normal.     Palpations: Abdomen is soft.     Tenderness: There is no abdominal tenderness.  Musculoskeletal:     Cervical back: Normal range of motion and neck supple.  Skin:    General: Skin is warm and dry.     Findings: No rash.  Neurological:     Mental Status:  She is alert.  Psychiatric:        Mood and Affect: Mood is not anxious or depressed.        Speech: Speech normal.        Behavior: Behavior normal. Behavior is cooperative.        Thought Content: Thought content normal.        Judgment: Judgment normal.       Results for orders placed or performed in visit on 04/23/24  POCT glycosylated hemoglobin (Hb A1C)   Collection Time: 04/23/24  8:54 AM  Result Value Ref Range   Hemoglobin A1C 6.8 (A) 4.0 - 5.6 %   HbA1c POC (<> result, manual entry)     HbA1c, POC (prediabetic range)     HbA1c, POC (controlled diabetic range)      Assessment and Plan  Type 2 diabetes mellitus with other circulatory complication, without long-term current use of insulin (HCC) Assessment & Plan: Stable, chronic.  Continue current medication.  Metformin  XR 500 mg 2 tablets in the morning and 1 tablet at night.   On statin Foot exam up-to-date Eye exam up-to-date Microalbumin up-to-date but was slightly abnormal last time.. wishes to recheck today.    Orders: -     Microalbumin / creatinine urine ratio -     POCT glycosylated hemoglobin (Hb A1C)  Essential hypertension, benign Assessment & Plan: Stable, chronic.  Continue current medication.  On lisinopril /hydrochlorothiazide  20/25 mg daily   Morbid obesity (HCC) Assessment & Plan: Chronic, BMI 47  Wt Readings from Last 3 Encounters:  04/23/24 287 lb (130.2 kg)  11/26/23 290 lb (131.5 kg)   11/12/23 290 lb (131.5 kg)      Hyperlipidemia, unspecified hyperlipidemia type Assessment & Plan: Inadequate control, chronic.   Only able to tolerate rosuvastatin  2 times a week.   Rosuvastatin  10 mg twice weekly    Mild intermittent asthma without complication Assessment & Plan: just an issue of she gets sick or change of season.  Rare use of albuterol  1-2 times per year.    Menopausal vaginal dryness Assessment & Plan: Chronic, she will try coconut oil for vaginal dryness.  If not improving we can consider vaginal estrogen for vaginal dryness.   Other orders -     Naproxen ; Take 1 tablet (500 mg total) by mouth 2 (two) times daily as needed.  Dispense: 60 tablet; Refill: 1    Return in about 6 months (around 10/21/2024) for annual physical with fasting labs prior.   Greig Ring, MD  "

## 2024-05-02 ENCOUNTER — Other Ambulatory Visit: Payer: Self-pay | Admitting: Family

## 2024-10-15 ENCOUNTER — Other Ambulatory Visit

## 2024-10-22 ENCOUNTER — Encounter: Admitting: Family Medicine
